# Patient Record
Sex: Female | Born: 1985 | Race: Black or African American | Hispanic: No | Marital: Single | Smoking: Current every day smoker
Health system: Southern US, Community
[De-identification: ages and names within clinical notes are randomized; demographics above are authoritative.]

## PROBLEM LIST (undated history)

## (undated) DIAGNOSIS — E66813 Obesity, class 3: Secondary | ICD-10-CM

## (undated) DIAGNOSIS — I10 Essential (primary) hypertension: Secondary | ICD-10-CM

## (undated) DIAGNOSIS — O24419 Gestational diabetes mellitus in pregnancy, unspecified control: Secondary | ICD-10-CM

## (undated) DIAGNOSIS — I509 Heart failure, unspecified: Secondary | ICD-10-CM

## (undated) DIAGNOSIS — Z8719 Personal history of other diseases of the digestive system: Secondary | ICD-10-CM

## (undated) DIAGNOSIS — R7303 Prediabetes: Secondary | ICD-10-CM

## (undated) HISTORY — DX: Personal history of other diseases of the digestive system: Z87.19

## (undated) HISTORY — DX: Morbid (severe) obesity due to excess calories: E66.01

## (undated) HISTORY — DX: Obesity, class 3: E66.813

## (undated) HISTORY — DX: Heart failure, unspecified: I50.9

## (undated) HISTORY — PX: LAPAROSCOPY: SHX197

## (undated) HISTORY — DX: Essential (primary) hypertension: I10

## (undated) HISTORY — DX: Prediabetes: R73.03

---

## 2013-05-08 DIAGNOSIS — R0683 Snoring: Secondary | ICD-10-CM | POA: Insufficient documentation

## 2013-05-08 DIAGNOSIS — K219 Gastro-esophageal reflux disease without esophagitis: Secondary | ICD-10-CM | POA: Insufficient documentation

## 2013-05-08 DIAGNOSIS — L83 Acanthosis nigricans: Secondary | ICD-10-CM | POA: Insufficient documentation

## 2015-02-22 NOTE — L&D Delivery Note (Signed)
Delivery Summary for [name] Wolf  Labor Events:   Preterm labor:   Rupture date:   Rupture time:   Rupture type: Artificial  Fluid Color: Clear  Induction:   Augmentation:   Complications:   Cervical ripening:          Delivery:   Episiotomy:   Lacerations:   Repair suture:   Repair # of packets:   Blood loss (ml): 200    Information for the patient's newborn:  [name], Boy [name] [[id number]][[id number]]    Delivery 10/11/2015 9:29 PM by  Vaginal, Spontaneous Delivery Sex:  female Gestational Age: 7650w3d Delivery Clinician:   Living?:         APGARS  One minute Five minutes Ten minutes  Skin color:        Heart rate:        Grimace:        Muscle tone:        Breathing:        Totals: 8  9      Presentation/position:      Resuscitation:   Cord information:    Disposition of cord blood:     Blood gases sent?  Complications:   Placenta: Delivered:       appearance Newborn Measurements: Weight: 8 lb 6 oz (3800 g)  Height: 8.47"  Head circumference:    Chest circumference:    Other providers:    Additional  information: Forceps:   Vacuum:   Breech:   Observed anomalies         Delivery Note At 9:29 PM a viable and healthy female was delivered via Vaginal, Spontaneous Delivery (Presentation: Vertex; LOA position).  APGAR: 8, 9; weight 8 lb 6 oz (3800 g).   Placenta status: spontaneously removed, intact.  Cord: 3-vessel, with the following complications: None.  Cord pH: not obtained.   Anesthesia:  Epidural Episiotomy: None Lacerations: None Suture Repair: None Est. Blood Loss (mL):  200  Mom to postpartum.  Baby to Couplet care / Skin to Skin.   [name] 10/11/2015, 10:41 PM

## 2015-03-06 ENCOUNTER — Other Ambulatory Visit (INDEPENDENT_AMBULATORY_CARE_PROVIDER_SITE_OTHER): Payer: Medicaid Other

## 2015-03-06 ENCOUNTER — Ambulatory Visit (INDEPENDENT_AMBULATORY_CARE_PROVIDER_SITE_OTHER): Payer: Medicaid Other | Admitting: Obstetrics and Gynecology

## 2015-03-06 VITALS — BP 118/90 | HR 98 | Ht 62.6 in | Wt 244.5 lb

## 2015-03-06 DIAGNOSIS — Z331 Pregnant state, incidental: Secondary | ICD-10-CM

## 2015-03-06 DIAGNOSIS — Z36 Encounter for antenatal screening of mother: Secondary | ICD-10-CM

## 2015-03-06 DIAGNOSIS — Z1389 Encounter for screening for other disorder: Secondary | ICD-10-CM

## 2015-03-06 DIAGNOSIS — R638 Other symptoms and signs concerning food and fluid intake: Secondary | ICD-10-CM

## 2015-03-06 DIAGNOSIS — Z3490 Encounter for supervision of normal pregnancy, unspecified, unspecified trimester: Secondary | ICD-10-CM

## 2015-03-06 DIAGNOSIS — Z113 Encounter for screening for infections with a predominantly sexual mode of transmission: Secondary | ICD-10-CM

## 2015-03-06 DIAGNOSIS — Z3687 Encounter for antenatal screening for uncertain dates: Secondary | ICD-10-CM

## 2015-03-06 DIAGNOSIS — Z369 Encounter for antenatal screening, unspecified: Secondary | ICD-10-CM

## 2015-03-06 NOTE — Progress Notes (Signed)
   [name] Wolf presents for NOB nurse interview visit. G-2.  P-1. Pt pregnancy [facility] at [facility] 02/12/15. LMP: 01/08/2015. EDD: 10/15/2015. Ultrasound ordered for unsure of lmp, viability and dating.  Pregnancy education material explained and given. No cats in the home. NOB labs ordered. TSH/HbgA1c due to Increased BMI, HIV labs and Drug screen were explained optional and she could opt out of tests but did not decline. Drug screen ordered. PNV encouraged. NT to discuss with provider. Pt. To follow up with provider in 4 weeks for NOB physical.  All questions answered.  ZIKA EXPOSURE SCREEN:  The patient has not traveled to a [place] endemic area within the past 6 months, nor has she had unprotected sex with a partner who has travelled to a [place] endemic region within the past 6 months. The patient has been advised to notify us if these factors change any time during this current pregnancy, so adequate testing and monitoring can be initiated.

## 2015-03-06 NOTE — Progress Notes (Signed)
I have reviewed the information as documented by Fenton Mallingebbie Ridgeway, LPN.

## 2015-03-06 NOTE — Patient Instructions (Signed)
Pregnancy and Zika Virus Disease Zika virus disease, or Zika, is an illness that can spread to people from mosquitoes that carry the virus. It may also spread from person to person through infected body fluids. Zika first occurred in [place], but recently it has spread to new areas. The virus occurs in tropical climates. The location of Zika continues to change. Most people who become infected with Zika virus do not develop serious illness. However, Zika may cause birth defects in an unborn baby whose mother is infected with the virus. It may also increase the risk of miscarriage. WHAT ARE THE SYMPTOMS OF ZIKA VIRUS DISEASE? In many cases, people who have been infected with Zika virus do not develop any symptoms. If symptoms appear, they usually start about a week after the person is infected. Symptoms are usually mild. They may include:  Fever.  Rash.  Red eyes.  Joint pain. HOW DOES ZIKA VIRUS DISEASE SPREAD? The main way that Zika virus spreads is through the bite of a certain type of mosquito. Unlike most types of mosquitos, which bite only at night, the type of mosquito that carries Zika virus bites both at night and during the day. Zika virus can also spread through sexual contact, through a blood transfusion, and from a mother to her baby before or during birth. Once you have had Zika virus disease, it is unlikely that you will get it again. CAN I PASS ZIKA TO MY BABY DURING PREGNANCY? Yes, Zika can pass from a mother to her baby before or during birth. WHAT PROBLEMS CAN ZIKA CAUSE FOR MY BABY? A woman who is infected with Zika virus while pregnant is at risk of having her baby born with a condition in which the brain or head is smaller than expected (microcephaly). Babies who have microcephaly can have developmental delays, seizures, hearing problems, and vision problems. Having Zika virus disease during pregnancy can also increase the risk of miscarriage. HOW CAN ZIKA VIRUS DISEASE BE  PREVENTED? There is no vaccine to prevent Zika. The best way to prevent the disease is to avoid infected mosquitoes and avoid exposure to body fluids that can spread the virus. Avoid any possible exposure to Zika by taking the following precautions. For women and their sex partners:  Avoid traveling to high-risk areas. The locations where Zika is being reported change often. To identify high-risk areas, check the CDC travel website: www.cdc.gov/zika/geo/index.html  If you or your sex partner must travel to a high-risk area, talk with a health care provider before and after traveling.  Take all precautions to avoid mosquito bites if you live in, or travel to, any of the high-risk areas. Insect repellents are safe to use during pregnancy.  Ask your health care provider when it is safe to have sexual contact. For women:  If you are pregnant or trying to become pregnant, avoid sexual contact with persons who may have been exposed to Zika virus, persons who have possible symptoms of Zika, or persons whose history you are unsure about. If you choose to have sexual contact with someone who may have been exposed to Zika virus, use condoms correctly during the entire duration of sexual activity, every time. Do not share sexual devices, as you may be exposed to body fluids.  Ask your health care provider about when it is safe to attempt pregnancy after a possible exposure to Zika virus. WHAT STEPS SHOULD I TAKE TO AVOID MOSQUITO BITES? Take these steps to avoid mosquito bites when you are   in a high-risk area:  Wear loose clothing that covers your arms and legs.  Limit your outdoor activities.  Do not open windows unless they have window screens.  Sleep under mosquito nets.  Use insect repellent. The best insect repellents have:  DEET, picaridin, oil of lemon eucalyptus (OLE), or IR3535 in them.  Higher amounts of an active ingredient in them.  Remember that insect repellents are safe to use  during pregnancy.  Do not use OLE on children who are younger than 3 years of age. Do not use insect repellent on babies who are younger than 2 months of age.  Cover your child's stroller with mosquito netting. Make sure the netting fits snugly and that any loose netting does not cover your child's mouth or nose. Do not use a blanket as a mosquito-protection cover.  Do not apply insect repellent underneath clothing.  If you are using sunscreen, apply the sunscreen before applying the insect repellent.  Treat clothing with permethrin. Do not apply permethrin directly to your skin. Follow label directions for safe use.  Get rid of standing water, where mosquitoes may reproduce. Standing water is often found in items such as buckets, bowls, animal food dishes, and flowerpots. When you return from traveling to any high-risk area, continue taking actions to protect yourself against mosquito bites for 3 weeks, even if you show no signs of illness. This will prevent spreading Zika virus to uninfected mosquitoes. WHAT SHOULD I KNOW ABOUT THE SEXUAL TRANSMISSION OF ZIKA? People can spread Zika to their sexual partners during vaginal, anal, or oral sex, or by sharing sexual devices. Many people with Zika do not develop symptoms, so a person could spread the disease without knowing that they are infected. The greatest risk is to women who are pregnant or who may become pregnant. Zika virus can live longer in semen than it can live in blood. Couples can prevent sexual transmission of the virus by:  Using condoms correctly during the entire duration of sexual activity, every time. This includes vaginal, anal, and oral sex.  Not sharing sexual devices. Sharing increases your risk of being exposed to body fluid from another person.  Avoiding all sexual activity until your health care provider says it is safe. SHOULD I BE TESTED FOR ZIKA VIRUS? A sample of your blood can be tested for Zika virus. A pregnant  woman should be tested if she may have been exposed to the virus or if she has symptoms of Zika. She may also have additional tests done during her pregnancy, such ultrasound testing. Talk with your health care provider about which tests are recommended.   This information is not intended to replace advice given to you by your health care provider. Make sure you discuss any questions you have with your health care provider.   Document Released: 10/29/2014 Document Reviewed: 10/22/2014 Elsevier Interactive Patient Education 2016 [employer] Commonly Asked Questions During Pregnancy  Cats: A parasite can be excreted in cat feces.  To avoid exposure you need to have another person empty the little box.  If you must empty the litter box you will need to wear gloves.  Wash your hands after handling your cat.  This parasite can also be found in raw or undercooked meat so this should also be avoided.  Colds, Sore Throats, Flu: Please check your medication sheet to see what you can take for symptoms.  If your symptoms are unrelieved by these medications please call the office.  Dental Work: Most   any dental work your dentist recommends is permitted.  X-rays should only be taken during the first trimester if absolutely necessary.  Your abdomen should be shielded with a lead apron during all x-rays.  Please notify your provider prior to receiving any x-rays.  Novocaine is fine; gas is not recommended.  If your dentist requires a note from us prior to dental work please call the office and we will provide one for you.  Exercise: Exercise is an important part of staying healthy during your pregnancy.  You may continue most exercises you were accustomed to prior to pregnancy.  Later in your pregnancy you will most likely notice you have difficulty with activities requiring balance like riding a bicycle.  It is important that you listen to your body and avoid activities that put you at a higher risk of falling.   Adequate rest and staying well hydrated are a must!  If you have questions about the safety of specific activities ask your provider.    Exposure to Children with illness: Try to avoid obvious exposure; report any symptoms to us when noted,  If you have chicken pos, red measles or mumps, you should be immune to these diseases.   Please do not take any vaccines while pregnant unless you have checked with your OB provider.  Fetal Movement: After 28 weeks we recommend you do "kick counts" twice daily.  Lie or sit down in a calm quiet environment and count your baby movements "kicks".  You should feel your baby at least 10 times per hour.  If you have not felt 10 kicks within the first hour get up, walk around and have something sweet to eat or drink then repeat for an additional hour.  If count remains less than 10 per hour notify your provider.  Fumigating: Follow your pest control agent's advice as to how long to stay out of your home.  Ventilate the area well before re-entering.  Hemorrhoids:   Most over-the-counter preparations can be used during pregnancy.  Check your medication to see what is safe to use.  It is important to use a stool softener or fiber in your diet and to drink lots of liquids.  If hemorrhoids seem to be getting worse please call the office.   Hot Tubs:  Hot tubs Jacuzzis and saunas are not recommended while pregnant.  These increase your internal body temperature and should be avoided.  Intercourse:  Sexual intercourse is safe during pregnancy as long as you are comfortable, unless otherwise advised by your provider.  Spotting may occur after intercourse; report any bright red bleeding that is heavier than spotting.  Labor:  If you know that you are in labor, please go to the hospital.  If you are unsure, please call the office and let us help you decide what to do.  Lifting, straining, etc:  If your job requires heavy lifting or straining please check with your provider for  any limitations.  Generally, you should not lift items heavier than that you can lift simply with your hands and arms (no back muscles)  Painting:  Paint fumes do not harm your pregnancy, but may make you ill and should be avoided if possible.  Latex or water based paints have less odor than oils.  Use adequate ventilation while painting.  Permanents & Hair Color:  Chemicals in hair dyes are not recommended as they cause increase hair dryness which can increase hair loss during pregnancy.  " Highlighting" and permanents are   allowed.  Dye may be absorbed differently and permanents may not hold as well during pregnancy.  Sunbathing:  Use a sunscreen, as skin burns easily during pregnancy.  Drink plenty of fluids; avoid over heating.  Tanning Beds:  Because their possible side effects are still unknown, tanning beds are not recommended.  Ultrasound Scans:  Routine ultrasounds are performed at approximately 20 weeks.  You will be able to see your baby's general anatomy an if you would like to know the gender this can usually be determined as well.  If it is questionable when you conceived you may also receive an ultrasound early in your pregnancy for dating purposes.  Otherwise ultrasound exams are not routinely performed unless there is a medical necessity.  Although you can request a scan we ask that you pay for it when conducted because insurance does not cover " patient request" scans.  Work: If your pregnancy proceeds without complications you may work until your due date, unless your physician or employer advises otherwise.  Round Ligament Pain/Pelvic Discomfort:  Sharp, shooting pains not associated with bleeding are fairly common, usually occurring in the second trimester of pregnancy.  They tend to be worse when standing up or when you remain standing for long periods of time.  These are the result of pressure of certain pelvic ligaments called "round ligaments".  Rest, Tylenol and heat seem to be  the most effective relief.  As the womb and fetus grow, they rise out of the pelvis and the discomfort improves.  Please notify the office if your pain seems different than that described.  It may represent a more serious condition.   

## 2015-03-07 LAB — URINALYSIS, ROUTINE W REFLEX MICROSCOPIC
BILIRUBIN UA: NEGATIVE
Glucose, UA: NEGATIVE
Ketones, UA: NEGATIVE
Leukocytes, UA: NEGATIVE
Nitrite, UA: NEGATIVE
PH UA: 6 (ref 5.0–7.5)
RBC UA: NEGATIVE
Urobilinogen, Ur: 0.2 mg/dL (ref 0.2–1.0)

## 2015-03-07 LAB — PAIN MGT SCRN (14 DRUGS), UR
Amphetamine Screen, Ur: NEGATIVE ng/mL
BARBITURATE SCRN UR: NEGATIVE ng/mL
BENZODIAZEPINE SCREEN, URINE: NEGATIVE ng/mL
BUPRENORPHINE, URINE: NEGATIVE ng/mL
CREATININE(CRT), U: 285.3 mg/dL (ref 20.0–300.0)
Cannabinoids Ur Ql Scn: NEGATIVE ng/mL
Cocaine(Metab.)Screen, Urine: NEGATIVE ng/mL
FENTANYL, URINE: NEGATIVE pg/mL
MEPERIDINE SCREEN, URINE: NEGATIVE ng/mL
METHADONE SCREEN, URINE: NEGATIVE ng/mL
OPIATE SCRN UR: NEGATIVE ng/mL
Oxycodone+Oxymorphone Ur Ql Scn: NEGATIVE ng/mL
PCP Scrn, Ur: NEGATIVE ng/mL
Ph of Urine: 5.7 (ref 4.5–8.9)
Propoxyphene, Screen: NEGATIVE ng/mL
Tramadol Ur Ql Scn: NEGATIVE ng/mL

## 2015-03-07 LAB — NICOTINE SCREEN, URINE: Cotinine Ql Scrn, Ur: POSITIVE ng/mL

## 2015-03-08 LAB — GC/CHLAMYDIA PROBE AMP
Chlamydia trachomatis, NAA: NEGATIVE
Neisseria gonorrhoeae by PCR: NEGATIVE

## 2015-03-09 LAB — CBC WITH DIFFERENTIAL/PLATELET
BASOS: 1 %
Basophils Absolute: 0.1 10*3/uL (ref 0.0–0.2)
EOS (ABSOLUTE): 0.1 10*3/uL (ref 0.0–0.4)
EOS: 2 %
HEMATOCRIT: 36.1 % (ref 34.0–46.6)
Hemoglobin: 11.9 g/dL (ref 11.1–15.9)
Immature Grans (Abs): 0 10*3/uL (ref 0.0–0.1)
Immature Granulocytes: 0 %
LYMPHS ABS: 3.2 10*3/uL — AB (ref 0.7–3.1)
Lymphs: 43 %
MCH: 28.1 pg (ref 26.6–33.0)
MCHC: 33 g/dL (ref 31.5–35.7)
MCV: 85 fL (ref 79–97)
MONOS ABS: 0.5 10*3/uL (ref 0.1–0.9)
Monocytes: 7 %
Neutrophils Absolute: 3.6 10*3/uL (ref 1.4–7.0)
Neutrophils: 47 %
Platelets: 336 10*3/uL (ref 150–379)
RBC: 4.24 x10E6/uL (ref 3.77–5.28)
RDW: 13.1 % (ref 12.3–15.4)
WBC: 7.5 10*3/uL (ref 3.4–10.8)

## 2015-03-09 LAB — TSH: TSH: 1.31 u[IU]/mL (ref 0.450–4.500)

## 2015-03-09 LAB — RH TYPE: RH TYPE: POSITIVE

## 2015-03-09 LAB — CULTURE, OB URINE

## 2015-03-09 LAB — URINE CULTURE, OB REFLEX

## 2015-03-09 LAB — SICKLE CELL SCREEN: SICKLE CELL SCREEN: NEGATIVE

## 2015-03-09 LAB — RUBELLA ANTIBODY, IGM

## 2015-03-09 LAB — VARICELLA ZOSTER ANTIBODY, IGM: VARICELLA IGM: 1.65 {index} — AB (ref 0.00–0.90)

## 2015-03-09 LAB — HEMOGLOBIN A1C
ESTIMATED AVERAGE GLUCOSE: 123 mg/dL
Hgb A1c MFr Bld: 5.9 % — ABNORMAL HIGH (ref 4.8–5.6)

## 2015-03-09 LAB — HIV ANTIBODY (ROUTINE TESTING W REFLEX): HIV Screen 4th Generation wRfx: NONREACTIVE

## 2015-03-09 LAB — RPR: RPR Ser Ql: NONREACTIVE

## 2015-03-09 LAB — HEPATITIS B SURFACE ANTIGEN: HEP B S AG: NEGATIVE

## 2015-03-09 LAB — ABO

## 2015-03-09 LAB — ANTIBODY SCREEN: Antibody Screen: NEGATIVE

## 2015-03-23 ENCOUNTER — Telehealth: Payer: Self-pay | Admitting: Obstetrics and Gynecology

## 2015-03-23 NOTE — Telephone Encounter (Signed)
PRENATALS THAT SHE IS ON IS MAKING SICK ON HER STOMACH CAN SHE HAVE SOMETHING ELSE.

## 2015-03-25 NOTE — Telephone Encounter (Signed)
Called pt advised her to take prenatal vitamin at night before going to be bed to help with upset stomach. Also advised pt on prenatal gummy. Pt gave verbal understanding.

## 2015-04-09 ENCOUNTER — Ambulatory Visit (INDEPENDENT_AMBULATORY_CARE_PROVIDER_SITE_OTHER): Payer: Medicaid Other | Admitting: Obstetrics and Gynecology

## 2015-04-09 VITALS — BP 108/69 | HR 108 | Wt 245.2 lb

## 2015-04-09 DIAGNOSIS — Z1379 Encounter for other screening for genetic and chromosomal anomalies: Secondary | ICD-10-CM

## 2015-04-09 DIAGNOSIS — IMO0001 Reserved for inherently not codable concepts without codable children: Secondary | ICD-10-CM

## 2015-04-09 DIAGNOSIS — Z3492 Encounter for supervision of normal pregnancy, unspecified, second trimester: Secondary | ICD-10-CM

## 2015-04-09 DIAGNOSIS — Z283 Underimmunization status: Secondary | ICD-10-CM

## 2015-04-09 DIAGNOSIS — Z3482 Encounter for supervision of other normal pregnancy, second trimester: Secondary | ICD-10-CM

## 2015-04-09 DIAGNOSIS — O358XX0 Maternal care for other (suspected) fetal abnormality and damage, not applicable or unspecified: Secondary | ICD-10-CM

## 2015-04-09 DIAGNOSIS — O09899 Supervision of other high risk pregnancies, unspecified trimester: Secondary | ICD-10-CM

## 2015-04-09 DIAGNOSIS — Z720 Tobacco use: Secondary | ICD-10-CM

## 2015-04-09 DIAGNOSIS — R7303 Prediabetes: Secondary | ICD-10-CM

## 2015-04-09 DIAGNOSIS — Z124 Encounter for screening for malignant neoplasm of cervix: Secondary | ICD-10-CM

## 2015-04-09 DIAGNOSIS — O9989 Other specified diseases and conditions complicating pregnancy, childbirth and the puerperium: Secondary | ICD-10-CM

## 2015-04-09 NOTE — Progress Notes (Signed)
OBSTETRIC INITIAL PRENATAL VISIT  Subjective:    [name] Wolf is being seen today for her first obstetrical visit.  This is not a planned pregnancy. She is a G70P1001 female at 35w0dgestation, Estimated Date of Delivery: 10/15/15 with Patient's last menstrual period was 01/08/2015 (approximate). (consistent with 7 week sono). Her obstetrical history is significant for obesity, smoker and pre-diabetes. Relationship with FOB: significant other, not living together. Patient does intend to breast feed. Pregnancy history fully reviewed.    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 No genetic disorders identified.       Gynecologic History:  Last pap smear was ~ 3 years ago.  Results were normal.  Denies h/o abnormal pap smears in the past.  Denies history of STIs.  H/o regular menstrual cycles.    Past Medical History  Diagnosis Date  . Hx of gallstones 124ysld    LAP removal of stones    Family History  Problem Relation Age of Onset  . Diabetes Sister   . Hypertension Mother   . Migraines Sister     Past Surgical History  Procedure Laterality Date  . Laparoscopy  1264o    gallstones removed    Social History   Social History  . Marital Status: Single    Spouse Name: N/A  . Number of Children: N/A  . Years of Education: N/A   Occupational History  . unemployed    Social History Main Topics  . Smoking status: Current Every Day Smoker  . Smokeless tobacco: Never Used  . Alcohol Use: No  . Drug Use: No  . Sexual Activity:    Partners: Male   Other Topics Concern  . Not on file   Social History Narrative  . No narrative on file    No current outpatient prescriptions on file prior  to visit.   No current facility-administered medications on file prior to visit.    No Known Allergies   Review of Systems General:Not Present- Fever, Weight Loss and Weight Gain. Skin:Not Present- Rash. HEENT:Not Present- Blurred Vision, Headache and Bleeding Gums. Respiratory:Not Present- Difficulty Breathing. Breast:Not Present- Breast Mass. Cardiovascular:Not Present- Chest Pain, Elevated Blood Pressure, Fainting / Blacking Out and Shortness of Breath. Gastrointestinal:Not Present- Abdominal Pain, Constipation, Nausea and Vomiting. Female Genitourinary:Not Present- Frequency, Painful Urination, Pelvic Pain, Vaginal Bleeding, Vaginal Discharge, Contractions, regular, Fetal Movements Decreased, Urinary Complaints and Vaginal Fluid. Musculoskeletal:Not Present- Back Pain and Leg Cramps. Neurological:Not Present- Dizziness. Psychiatric:Not Present- Depression.     Objective:   Blood pressure 108/69, pulse 108, weight 245 lb 3.2 oz (111.222 kg), last menstrual period 01/08/2015.  Body mass index is 43.99 kg/(m^2).  General Appearance:    Alert, cooperative, no distress, appears stated age, obese  Head:    Normocephalic, without obvious abnormality, atraumatic  Eyes:    PERRL, conjunctiva/corneas clear, EOM's intact, both eyes  Ears:    Normal external ear canals, both ears  Nose:   Nares normal,  septum midline, mucosa normal, no drainage or sinus tenderness  Throat:   Lips, mucosa, and tongue normal; teeth and gums normal  Neck:   Supple, symmetrical, trachea midline, no adenopathy; thyroid: no enlargement/tenderness/nodules; no carotid bruit or JVD  Back:     Symmetric, no curvature, ROM normal, no CVA tenderness  Lungs:     Clear to auscultation bilaterally, respirations unlabored  Chest Wall:    No tenderness or deformity   Heart:    Regular rate and rhythm, S1 and S2 normal, no murmur, rub or gallop  Breast Exam:    No tenderness, masses, or nipple abnormality    Abdomen:     Soft, non-tender, bowel sounds active all four quadrants, no masses, no organomegaly.  FH difficult to assess due to body habitus.  FHT 166 bpm.  Genitalia:    Pelvic:external genitalia normal, vagina without lesions, discharge, or tenderness, rectovaginal septum  normal. Cervix normal in appearance, no cervical motion tenderness, no adnexal masses or tenderness.  Pregnancy positive findings: uterine enlargement: 14 wk size (although difficult to assess due to body habitus), nontender.   Rectal:    Normal external sphincter.  No hemorrhoids appreciated. Internal exam not done.   Extremities:   Extremities normal, atraumatic, no cyanosis or edema  Pulses:   2+ and symmetric all extremities  Skin:   Skin color, texture, turgor normal, no rashes or lesions  Lymph nodes:   Cervical, supraclavicular, and axillary nodes normal  Neurologic:   CNII-XII intact, normal strength, sensation and reflexes throughout    Assessment:    Pregnancy at 13 and 0/7 weeks   Obesity Class III Tobacco abuse Prior fetus with isolated congenital anomaly Pre-diabetes Rubella non-immune   Plan:   Initial labs reviewed.  Needs MMR postpartum  Prenatal vitamins encouraged. Problem list reviewed and updated. New OB counseling:  The patient has been given an overview regarding routine prenatal care.  Recommendations regarding diet, weight gain, and exercise in pregnancy were given.  Patient advised not to gain any more than 15 lbs this pregnancy.  Prenatal testing, optional genetic testing, and ultrasound use in pregnancy were reviewed.  AFP3 discussed: ordered.  Patient may also benefit from consultation with MFM due to h/o fetal congenital anomalies in prior pregnancy.  Benefits of Breast Feeding were discussed. The patient is encouraged to consider nursing her baby post partum. Tobacco use - patient is down to ~2-3 cig/every 2-3 days.  Is having a hard time quitting.  Advised on assessing triggers,  nicotine gum (has tried non-nicotine gum/lozenges/candy without success).  Will f/u progress at next visit.  Advised on beginning daily baby aspirin due to obesity as risk factor for pre-eclampsia.  Prediabetes with recent HgbA1c 5.9%. Will need early glucola.  Can perform at same time that she performs 1st trimester screen next week.  Follow up in 4 weeks.  15% of 30 min visit spent on counseling and coordination of care.

## 2015-04-09 NOTE — Patient Instructions (Signed)
RE: MyChart  Dear [name]  We are excited to introduce MyChart, a new best-in-class service that provides you online access to important information in your electronic medical record. We want to make it easier for you to view your health information - all in one secure location - when and where you need it. We expect MyChart will enhance the quality of care and service we provide. Use the activation code below to enroll in MyChart online at [email or web address]  When you register for MyChart, you can:  [name] View your test results. . Communicate securely with your physician's office.  . View your medical history, allergies, medications, and immunizations. . Conveniently print information such as your medication lists.  If you are age 29 or older and want a member of your family to have access to your record, you must provide written consent by completing a proxy form available at our facility. Please speak to our clinical staff about guidelines regarding accounts for patients younger than age 53.  As you activate your MyChart account and need any technical assistance, please call the MyChart technical support line at [phone]) or email your question to [email or web address]. If you email your question(s), please include your name, a return phone number and the best time to reach you.  Thank you for using MyChart as your new health and wellness resource!  MyChart Activation Code:  [email or web address] Expires: 04/19/2015  2:38 PM          [facility]  [address] [place], [place] [address]      Prenatal Care WHAT IS PRENATAL CARE?  Prenatal care is the process of caring for a pregnant woman before she gives birth. Prenatal care makes sure that she and her baby remain as healthy as possible throughout pregnancy. Prenatal care may be provided by a midwife, family practice health care provider, or a childbirth and pregnancy specialist  (obstetrician). Prenatal care may include physical examinations, testing, treatments, and education on nutrition, lifestyle, and social support services. WHY IS PRENATAL CARE SO IMPORTANT?  Early and consistent prenatal care increases the chance that you and your baby will remain healthy throughout your pregnancy. This type of care also decreases a baby's risk of being born too early (prematurely), or being born smaller than expected (small for gestational age). Any underlying medical conditions you may have that could pose a risk during your pregnancy are discussed during prenatal care visits. You will also be monitored regularly for any new conditions that may arise during your pregnancy so they can be treated quickly and effectively. WHAT HAPPENS DURING PRENATAL CARE VISITS? Prenatal care visits may include the following: Discussion Tell your health care provider about any new signs or symptoms you have experienced since your last visit. These might include:  Nausea or vomiting.  Increased or decreased level of energy.  Difficulty sleeping.  Back or leg pain.  Weight changes.  Frequent urination.  Shortness of breath with physical activity.  Changes in your skin, such as the development of a rash or itchiness.  Vaginal discharge or bleeding.  Feelings of excitement or nervousness.  Changes in your baby's movements. You may want to write down any questions or topics you want to discuss with your health care provider and bring them with you to your appointment. Examination During your first prenatal care visit, you will likely have a complete physical exam. Your health care provider will often examine your vagina, cervix, and the position of your uterus, as  well as check your heart, lungs, and other body systems. As your pregnancy progresses, your health care provider will measure the size of your uterus and your baby's position inside your uterus. He or she may also examine you for  early signs of labor. Your prenatal visits may also include checking your blood pressure and, after about 10-12 weeks of pregnancy, listening to your baby's heartbeat. Testing Regular testing often includes:  Urinalysis. This checks your urine for glucose, protein, or signs of infection.  Blood count. This checks the levels of white and red blood cells in your body.  Tests for sexually transmitted infections (STIs). Testing for STIs at the beginning of pregnancy is routinely done and is required in many states.  Antibody testing. You will be checked to see if you are immune to certain illnesses, such as rubella, that can affect a developing fetus.  Glucose screen. Around 24-28 weeks of pregnancy, your blood glucose level will be checked for signs of gestational diabetes. Follow-up tests may be recommended.  Group B strep. This is a bacteria that is commonly found inside a woman's vagina. This test will inform your health care provider if you need an antibiotic to reduce the amount of this bacteria in your body prior to labor and childbirth.  Ultrasound. Many pregnant women undergo an ultrasound screening around 18-20 weeks of pregnancy to evaluate the health of the fetus and check for any developmental abnormalities.  HIV (human immunodeficiency virus) testing. Early in your pregnancy, you will be screened for HIV. If you are at high risk for HIV, this test may be repeated during your third trimester of pregnancy. You may be offered other testing based on your age, personal or family medical history, or other factors.  HOW OFTEN SHOULD I PLAN TO SEE MY HEALTH CARE PROVIDER FOR PRENATAL CARE? Your prenatal care check-up schedule depends on any medical conditions you have before, or develop during, your pregnancy. If you do not have any underlying medical conditions, you will likely be seen for checkups:  Monthly, during the first 6 months of pregnancy.  Twice a month during months 7 and 8 of  pregnancy.  Weekly starting in the 9th month of pregnancy and until delivery. If you develop signs of early labor or other concerning signs or symptoms, you may need to see your health care provider more often. Ask your health care provider what prenatal care schedule is best for you. WHAT CAN I DO TO KEEP MYSELF AND MY BABY AS HEALTHY AS POSSIBLE DURING MY PREGNANCY?  Take a prenatal vitamin containing 400 micrograms (0.4 mg) of folic acid every day. Your health care provider may also ask you to take additional vitamins such as iodine, vitamin D, iron, copper, and zinc.  Take 1500-2000 mg of calcium daily starting at your 20th week of pregnancy until you deliver your baby.  Make sure you are up to date on your vaccinations. Unless directed otherwise by your health care provider:  You should receive a tetanus, diphtheria, and pertussis (Tdap) vaccination between the 27th and 36th week of your pregnancy, regardless of when your last Tdap immunization occurred. This helps protect your baby from whooping cough (pertussis) after he or she is born.  You should receive an annual inactivated influenza vaccine (IIV) to help protect you and your baby from influenza. This can be done at any point during your pregnancy.  Eat a well-rounded diet that includes:  Fresh fruits and vegetables.  Lean proteins.  Calcium-rich foods such  as milk, yogurt, hard cheeses, and dark, leafy greens.  Whole grain breads.  Do noteat seafood high in mercury, including:  Swordfish.  Tilefish.  Shark.  King mackerel.  More than 6 oz tuna per week.  Do not eat:  Raw or undercooked meats or eggs.  Unpasteurized foods, such as soft cheeses (brie, blue, or feta), juices, and milks.  Lunch meats.  Hot dogs that have not been heated until they are steaming.  Drink enough water to keep your urine clear or pale yellow. For many women, this may be 10 or more 8 oz glasses of water each day. Keeping yourself  hydrated helps deliver nutrients to your baby and may prevent the start of pre-term uterine contractions.  Do not use any tobacco products including cigarettes, chewing tobacco, or electronic cigarettes. If you need help quitting, ask your health care provider.  Do not drink beverages containing alcohol. No safe level of alcohol consumption during pregnancy has been determined.  Do not use any illegal drugs. These can harm your developing baby or cause a miscarriage.  Ask your health care provider or pharmacist before taking any prescription or over-the-counter medicines, herbs, or supplements.  Limit your caffeine intake to no more than 200 mg per day.  Exercise. Unless told otherwise by your health care provider, try to get 30 minutes of moderate exercise most days of the week. Do not  do high-impact activities, contact sports, or activities with a high risk of falling, such as horseback riding or downhill skiing.  Get plenty of rest.  Avoid anything that raises your body temperature, such as hot tubs and saunas.  If you own a cat, do not empty its litter box. Bacteria contained in cat feces can cause an infection called toxoplasmosis. This can result in serious harm to the fetus.  Stay away from chemicals such as insecticides, lead, mercury, and cleaning or paint products that contain solvents.  Do not have any X-rays taken unless medically necessary.  Take a childbirth and breastfeeding preparation class. Ask your health care provider if you need a referral or recommendation.   This information is not intended to replace advice given to you by your health care provider. Make sure you discuss any questions you have with your health care provider.   Document Released: 02/10/2003 Document Revised: 02/28/2014 Document Reviewed: 04/24/2013 Elsevier Interactive Patient Education [employer]

## 2015-04-10 ENCOUNTER — Encounter: Payer: Self-pay | Admitting: Obstetrics and Gynecology

## 2015-04-10 DIAGNOSIS — Z283 Underimmunization status: Secondary | ICD-10-CM | POA: Insufficient documentation

## 2015-04-10 DIAGNOSIS — Z720 Tobacco use: Secondary | ICD-10-CM

## 2015-04-10 DIAGNOSIS — E66813 Obesity, class 3: Secondary | ICD-10-CM | POA: Insufficient documentation

## 2015-04-10 DIAGNOSIS — IMO0002 Reserved for concepts with insufficient information to code with codable children: Secondary | ICD-10-CM | POA: Insufficient documentation

## 2015-04-10 DIAGNOSIS — O09899 Supervision of other high risk pregnancies, unspecified trimester: Secondary | ICD-10-CM | POA: Insufficient documentation

## 2015-04-10 DIAGNOSIS — Z1379 Encounter for other screening for genetic and chromosomal anomalies: Secondary | ICD-10-CM | POA: Insufficient documentation

## 2015-04-10 DIAGNOSIS — R7303 Prediabetes: Secondary | ICD-10-CM | POA: Insufficient documentation

## 2015-04-10 DIAGNOSIS — O9989 Other specified diseases and conditions complicating pregnancy, childbirth and the puerperium: Secondary | ICD-10-CM

## 2015-04-10 DIAGNOSIS — O0993 Supervision of high risk pregnancy, unspecified, third trimester: Secondary | ICD-10-CM | POA: Insufficient documentation

## 2015-04-10 HISTORY — DX: Tobacco use: Z72.0

## 2015-04-10 NOTE — Addendum Note (Signed)
Addended by: Fabian November on: 04/10/2015 08:51 PM   Modules accepted: Orders

## 2015-04-13 NOTE — Addendum Note (Signed)
Addended by: Frankey Shown on: 04/13/2015 02:24 PM   Modules accepted: Orders

## 2015-04-15 ENCOUNTER — Other Ambulatory Visit: Payer: Medicaid Other

## 2015-04-17 LAB — PAP IG, CT-NG, RFX HPV ASCU
CHLAMYDIA, NUC. ACID AMP: NEGATIVE
Gonococcus by Nucleic Acid Amp: NEGATIVE
PAP SMEAR COMMENT: 0

## 2015-04-23 ENCOUNTER — Ambulatory Visit: Payer: Self-pay

## 2015-04-23 ENCOUNTER — Institutional Professional Consult (permissible substitution): Payer: Self-pay

## 2015-04-24 ENCOUNTER — Telehealth: Payer: Self-pay

## 2015-04-24 NOTE — Telephone Encounter (Signed)
Called pt no answer, LM informing pt of negative PAP

## 2015-04-24 NOTE — Telephone Encounter (Signed)
-----   Message from Hildred LaserAnika Cherry, MD sent at 04/20/2015  1:47 PM EST ----- Please inform patient of norma pap smear.

## 2015-05-06 ENCOUNTER — Ambulatory Visit (INDEPENDENT_AMBULATORY_CARE_PROVIDER_SITE_OTHER): Payer: Medicaid Other | Admitting: Obstetrics and Gynecology

## 2015-05-06 ENCOUNTER — Encounter: Payer: Self-pay | Admitting: Obstetrics and Gynecology

## 2015-05-06 VITALS — BP 149/73 | HR 102 | Wt 250.3 lb

## 2015-05-06 DIAGNOSIS — IMO0001 Reserved for inherently not codable concepts without codable children: Secondary | ICD-10-CM

## 2015-05-06 DIAGNOSIS — O358XX0 Maternal care for other (suspected) fetal abnormality and damage, not applicable or unspecified: Secondary | ICD-10-CM

## 2015-05-06 DIAGNOSIS — R7303 Prediabetes: Secondary | ICD-10-CM

## 2015-05-06 DIAGNOSIS — Z3482 Encounter for supervision of other normal pregnancy, second trimester: Secondary | ICD-10-CM

## 2015-05-06 DIAGNOSIS — Z3492 Encounter for supervision of normal pregnancy, unspecified, second trimester: Secondary | ICD-10-CM

## 2015-05-06 DIAGNOSIS — Z1379 Encounter for other screening for genetic and chromosomal anomalies: Secondary | ICD-10-CM

## 2015-05-06 LAB — POCT URINALYSIS DIPSTICK
Bilirubin, UA: NEGATIVE
Blood, UA: NEGATIVE
Glucose, UA: NEGATIVE
Ketones, UA: 80
Leukocytes, UA: NEGATIVE
NITRITE UA: NEGATIVE
UROBILINOGEN UA: NEGATIVE
pH, UA: 6

## 2015-05-06 NOTE — Progress Notes (Signed)
ROB: Patient notes URI symptoms, currently taking Sudafed (likely cause of elevated BP).  Missed 1st trimester NT scan visit.  Will perform 2nd trimester screen today.  Is completing early glucola due to risk factors of obesity and reported h/o pre-diabetes.  Missed [facility] consultation for h/o prior fetus with isolated congenital anomaly, needs to be rescheduled.  RTC in 4 weeks.  For anatomy scan at that time (unless earlier [facility]).

## 2015-05-07 LAB — GLUCOSE, 1 HOUR GESTATIONAL: Gestational Diabetes Screen: 220 mg/dL — ABNORMAL HIGH (ref 65–139)

## 2015-05-11 ENCOUNTER — Telehealth: Payer: Self-pay

## 2015-05-11 DIAGNOSIS — O24419 Gestational diabetes mellitus in pregnancy, unspecified control: Secondary | ICD-10-CM

## 2015-05-11 NOTE — Telephone Encounter (Signed)
-----   Message from Hildred LaserAnika Cherry, MD sent at 05/10/2015 12:14 PM EDT ----- Please inform patient that she is gestational diabetic based on elevated glucola.  Needs referral to [facility].

## 2015-05-11 NOTE — Telephone Encounter (Signed)
Called pt no answer, unable to leave message as no voicemail is set up. Will call again, referral placed.

## 2015-05-12 NOTE — Telephone Encounter (Signed)
Called pt, no answer, LM for pt to call back  

## 2015-05-14 NOTE — Telephone Encounter (Signed)
PT CALLED AND WAS RETURNING YOUR CALL SHE SAID TO CALL HER AT THIS NUMBER [phone], HER OTHER NUMBER IS NOT WORKING

## 2015-05-15 NOTE — Telephone Encounter (Signed)
Called number listed below. No answer. LM for pt.

## 2015-05-19 ENCOUNTER — Ambulatory Visit: Payer: Self-pay | Admitting: Dietician

## 2015-05-25 ENCOUNTER — Encounter: Payer: Self-pay | Admitting: Dietician

## 2015-05-25 ENCOUNTER — Telehealth: Payer: Self-pay | Admitting: Obstetrics and Gynecology

## 2015-05-25 ENCOUNTER — Encounter: Payer: Medicaid Other | Attending: Obstetrics and Gynecology | Admitting: Dietician

## 2015-05-25 VITALS — BP 100/72 | Ht 62.0 in | Wt 252.6 lb

## 2015-05-25 DIAGNOSIS — O24410 Gestational diabetes mellitus in pregnancy, diet controlled: Secondary | ICD-10-CM

## 2015-05-25 DIAGNOSIS — O24419 Gestational diabetes mellitus in pregnancy, unspecified control: Secondary | ICD-10-CM | POA: Diagnosis present

## 2015-05-25 MED ORDER — GLUCOSE BLOOD VI STRP: ORAL_STRIP | Status: DC

## 2015-05-25 MED ORDER — ACCU-CHEK FASTCLIX LANCETS MISC: Status: DC

## 2015-05-25 NOTE — Progress Notes (Signed)
Diabetes Self-Management Education  Visit Type: First/Initial  Appt. Start Time: 1015 Appt. End Time: 1145  05/25/2015  [name], identified by name and date of birth, is a 30 y.o. female with a diagnosis of Diabetes: Gestational Diabetes.   ASSESSMENT  Blood pressure 100/72, height 5\' 2"  (1.575 m), weight 252 lb 9.6 oz (114.579 kg), last menstrual period 01/08/2015. Body mass index is 46.19 kg/(m^2). Lacks knowledge of diabetes care and diet Is not checking BG's-does not have a BG meter     Diabetes Self-Management Education - 05/25/15 1323    Visit Information   Visit Type First/Initial   Initial Visit   Diabetes Type Gestational Diabetes   Health Coping   How would you rate your overall health? Fair   Psychosocial Assessment   Patient Concerns Weight Control  prevent complications, become more fit   Preferred Learning Style Visual;Auditory   Learning Readiness Contemplating   What is the last grade level you completed in school? 9   Complications   How often do you check your blood sugar? 0 times/day (not testing)   Have you had a dilated eye exam in the past 12 months? No  2 yr ago   Have you had a dental exam in the past 12 months? No  2 yr ago   Are you checking your feet? No   Dietary Intake   Breakfast --  eats 3 meals/day at 9:30am, 12:30pm, 6pm   Snack (morning) --  does not eat snacks   Beverage(s) --  Drinks 2-3 glasses of sweetened beverages /day and 2-3 glasses of diet drinks/day; drinks water 4-5x/day   Exercise   Exercise Type --  does no regular exercise   Patient Education   Previous Diabetes Education Yes (please comment)  when pregnant with first child and BG was slightly elevated   Disease state  --  pathophysiology of GDM and treatment options   Nutrition management  Role of diet in the treatment of diabetes and the relationship between the three main macronutrients and blood glucose level;Food label reading, portion sizes and  measuring food.;Carbohydrate counting   Physical activity and exercise  --  importance of exercise to promote BG control-encouraged to walk 20-30 min daily if permitted by MD   Medications --  reviewed medication options for treatment of GDM    Monitoring Taught/evaluated SMBG meter.;Purpose and frequency of SMBG.;Taught/discussed recording of test results and interpretation of SMBG.  gave pt Accuchek Aviva Plus meter and instructed on its use-BG 92 ac lunch   Acute complications Discussed and identified patients' treatment of hyperglycemia.   Chronic complications Relationship between chronic complications and blood glucose control   Psychosocial adjustment Role of stress on diabetes   Preconception care Pregnancy and GDM  Role of pre-pregnancy blood glucose control on the development of the fetus;Reviewed with patient blood glucose goals with pregnancy;Role of family planning for patients with diabetes   Personal strategies to promote health Lifestyle issues that need to be addressed for better diabetes care      Individualized Plan for Diabetes Self-Management Training:   Learning Objective:  Patient will have a greater understanding of diabetes self-management. Patient education plan is to attend individual and/or group sessions per assessed needs and concerns.   Plan:   Patient Instructions  Appt. Start Time: 1015 Appt. End Time:1145  GDM Class 1 Diabetes Overview - define DM; state own type of DM; identify functions of pancreas and insulin; define insulin deficiency vs insulin resistance  Psychosocial -  identify DM as a source of stress; state the effects of stress on BG control; verbalize appropriate stress management techniques; identify personal stress issues   Nutritional Management - describe effects of food on blood glucose; identify sources of carbohydrate, protein and fat; verbalize the importance of balance meals in controlling blood glucose; identify meals as well  balanced or not; estimate servings of carbohydrate from menus; use food labels to identify servings size, content of carbohydrate, fiber, protein, fat, saturated fat and sodium; recognize food sources of fat, saturated fat, trans fat, sodium and verbalize goals for intake; describe healthful appropriate food choices when dining out   Exercise - describe the effects of exercise on blood glucose and importance of regular exercise in controlling diabetes; state a plan for personal exercise; verbalize contraindications for exercise  Medications - state name, dose, timing of currently prescribed medications; describe types of medications available for diabetes  Insulin Training - prepare and administer insulin accurately; state correct rotation pattern; verbalize safe and lawful needle disposal  Self-Monitoring - state importance of HBGM and demo procedure accurately; use HBGM results to effectively manage diabetes; identify importance of regular HbA1C testing and goals for results  Acute Complications/Sick Day Guidelines - recognize hyperglycemia and hypoglycemia with causes and effects; identify blood glucose results as high, low or in control; list steps in treating and preventing high and low blood glucose; state appropriate measure to manage blood glucose when ill (need for meds, HBGM plan, when to call physician, need for fluids)  Chronic Complications/Foot, Skin, Eye Dental Care - identify possible long-term complications of diabetes (retinopathy, neuropathy, nephropathy, cardiovascular disease, infections); explain steps in prevention and treatment of chronic complications; state importance of daily self-foot exams; describe how to examine feet and what to look for; explain appropriate eye and dental care  Lifestyle Changes/Goals & Health/Community Resources - state benefits of making appropriate lifestyle changes; identify habits that need to change (meals, tobacco, alcohol); identify strategies to  reduce risk factors for personal health; set goals for proper diabetes care; state need for and frequency of healthcare follow-up; describe appropriate community resources for good health (ADA, web sites, apps)   Pregnancy/Sexual Health - define gestational diabetes; state importance of good blood glucose control and birth control prior to pregnancy; state importance of good blood glucose control in preventing sexual problems (impotence, vaginal dryness, infections, loss of desire); state relationship of blood glucose control and pregnancy outcome; describe risk of maternal and fetal complications  Teaching Materials Used: Meter-Accuchek Aviva Plus meter General Meal Planning Guidelines Daily Food Record Gestational Diabetes Booklet Gestational Video Goals for Healthy Pregnancy    Expected Outcomes:     Education material provided: General Meal Planning Guidelines, Accuchek Aviva Plus meter, GDM booklet, GDM video  If problems or questions, patient to contact team via:  [phone]  Future DSME appointment:  06-09-15

## 2015-05-25 NOTE — Patient Instructions (Signed)
Appt. Start Time: 1015 Appt. End Time:1145  GDM Class 1 Diabetes Overview - define DM; state own type of DM; identify functions of pancreas and insulin; define insulin deficiency vs insulin resistance  Psychosocial - identify DM as a source of stress; state the effects of stress on BG control; verbalize appropriate stress management techniques; identify personal stress issues   Nutritional Management - describe effects of food on blood glucose; identify sources of carbohydrate, protein and fat; verbalize the importance of balance meals in controlling blood glucose; identify meals as well balanced or not; estimate servings of carbohydrate from menus; use food labels to identify servings size, content of carbohydrate, fiber, protein, fat, saturated fat and sodium; recognize food sources of fat, saturated fat, trans fat, sodium and verbalize goals for intake; describe healthful appropriate food choices when dining out   Exercise - describe the effects of exercise on blood glucose and importance of regular exercise in controlling diabetes; state a plan for personal exercise; verbalize contraindications for exercise  Medications - state name, dose, timing of currently prescribed medications; describe types of medications available for diabetes  Insulin Training - prepare and administer insulin accurately; state correct rotation pattern; verbalize safe and lawful needle disposal  Self-Monitoring - state importance of HBGM and demo procedure accurately; use HBGM results to effectively manage diabetes; identify importance of regular HbA1C testing and goals for results  Acute Complications/Sick Day Guidelines - recognize hyperglycemia and hypoglycemia with causes and effects; identify blood glucose results as high, low or in control; list steps in treating and preventing high and low blood glucose; state appropriate measure to manage blood glucose when ill (need for meds, HBGM plan, when to call physician,  need for fluids)  Chronic Complications/Foot, Skin, Eye Dental Care - identify possible long-term complications of diabetes (retinopathy, neuropathy, nephropathy, cardiovascular disease, infections); explain steps in prevention and treatment of chronic complications; state importance of daily self-foot exams; describe how to examine feet and what to look for; explain appropriate eye and dental care  Lifestyle Changes/Goals & Health/Community Resources - state benefits of making appropriate lifestyle changes; identify habits that need to change (meals, tobacco, alcohol); identify strategies to reduce risk factors for personal health; set goals for proper diabetes care; state need for and frequency of healthcare follow-up; describe appropriate community resources for good health (ADA, web sites, apps)   Pregnancy/Sexual Health - define gestational diabetes; state importance of good blood glucose control and birth control prior to pregnancy; state importance of good blood glucose control in preventing sexual problems (impotence, vaginal dryness, infections, loss of desire); state relationship of blood glucose control and pregnancy outcome; describe risk of maternal and fetal complications  Teaching Materials Used: Meter-Accuchek Aviva Plus meter General Meal Planning Guidelines Daily Food Record Gestational Diabetes Booklet Gestational Video Goals for Healthy Pregnancy

## 2015-05-25 NOTE — Telephone Encounter (Signed)
[facility] AND NEED[facility] A RX FOR ACCU CHEK AVIVA PLUS TEST STRIPES AND ACCU CHEK FAST CLICK LANCETS. SHE USES [facility]

## 2015-05-25 NOTE — Telephone Encounter (Signed)
Called pt informed her that RX was sent in.

## 2015-06-03 ENCOUNTER — Ambulatory Visit (INDEPENDENT_AMBULATORY_CARE_PROVIDER_SITE_OTHER): Payer: Medicaid Other

## 2015-06-03 ENCOUNTER — Other Ambulatory Visit: Payer: Medicaid Other

## 2015-06-03 ENCOUNTER — Ambulatory Visit (INDEPENDENT_AMBULATORY_CARE_PROVIDER_SITE_OTHER): Payer: Medicaid Other | Admitting: Obstetrics and Gynecology

## 2015-06-03 VITALS — BP 125/86 | HR 90 | Wt 253.8 lb

## 2015-06-03 DIAGNOSIS — Z3482 Encounter for supervision of other normal pregnancy, second trimester: Secondary | ICD-10-CM

## 2015-06-03 DIAGNOSIS — Z720 Tobacco use: Secondary | ICD-10-CM

## 2015-06-03 DIAGNOSIS — O24410 Gestational diabetes mellitus in pregnancy, diet controlled: Secondary | ICD-10-CM

## 2015-06-03 DIAGNOSIS — Z3492 Encounter for supervision of normal pregnancy, unspecified, second trimester: Secondary | ICD-10-CM

## 2015-06-03 DIAGNOSIS — O24415 Gestational diabetes mellitus in pregnancy, controlled by oral hypoglycemic drugs: Secondary | ICD-10-CM | POA: Insufficient documentation

## 2015-06-03 LAB — POCT URINALYSIS DIPSTICK
Bilirubin, UA: NEGATIVE
Glucose, UA: NEGATIVE
KETONES UA: 15
LEUKOCYTES UA: NEGATIVE
NITRITE UA: NEGATIVE
PH UA: 6
RBC UA: NEGATIVE
Spec Grav, UA: >=1.03
Urobilinogen, UA: NEGATIVE

## 2015-06-03 NOTE — Progress Notes (Signed)
ROB: Patient denies major complaints.  2nd trimester screen normal. Had abnormal 1 hr early glucola (224).  Has gestational diabetes, seen in [facility].  Notes fasting glucose this morning was 119.  Notes postprandials have been 120s-140s.  Patient notes partial compliance with diet.  Reiterated stricter adherance to diet.  To f/u in 1 week for blood sugar review (did not bring log today).  Patient may likely need to be initiated on medications. Also [facility] (patient with prior fetus with isolated congenital anomaly).  May also need to be seen for diabetes as well.

## 2015-06-09 ENCOUNTER — Ambulatory Visit: Payer: Medicaid Other | Admitting: Dietician

## 2015-06-10 ENCOUNTER — Ambulatory Visit: Payer: Medicaid Other | Admitting: Dietician

## 2015-06-11 ENCOUNTER — Ambulatory Visit
Admission: RE | Admit: 2015-06-11 | Discharge: 2015-06-11 | Disposition: A | Discharge status: Home / Self Care | Payer: Medicaid Other | Source: Ambulatory Visit | Attending: Obstetrics & Gynecology | Admitting: Obstetrics & Gynecology

## 2015-06-11 VITALS — BP 118/59 | HR 96 | Temp 97.9°F | Resp 18 | Wt 252.6 lb

## 2015-06-11 DIAGNOSIS — Z8279 Family history of other congenital malformations, deformations and chromosomal abnormalities: Secondary | ICD-10-CM

## 2015-06-11 LAB — AFP, QUAD SCREEN
DIA MOM VALUE: 1.06
DIA Value (EIA): 139.55 pg/mL
DSR (BY AGE) 1 IN: 789
DSR (SECOND TRIMESTER) 1 IN: 1749
Gestational Age: 16.9 WEEKS
MATERNAL AGE AT EDD: 28.9 a
MSAFP MOM: 0.54
MSAFP: 16.2 ng/mL
MSHCG Mom: 0.84
MSHCG: 20749 m[IU]/mL
OSB RISK: 10000
PDF: 0
Test Results:: NEGATIVE
UE3 MOM: 0.74
UE3 VALUE: 0.67 ng/mL
Weight: 250 [lb_av]

## 2015-06-11 NOTE — Progress Notes (Addendum)
Referring physician:  [facility] 45 minute consultation  Ms. 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  This letter is a summary of our conversation.  We first obtained a detailed family history and pregnancy history.  The patient reported that this is her second pregnancy, the first with her current partner.  The father of the baby, Mikle BosworthCarlos, has a 29 year old son who is in good health.  The patient has an 29 year old daughter, Penne LashJamaya, from a prior relationship. 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  She had surgery after birth and is now in good health, with no other birth differences. She has had recurrent urinary tract infections and may soon meet with a urologist. Penne LashJamaya is currently in 3rd grade and is receiving special help with math and reading through an IEP.  No specific reason for her physical differences or learning concerns is known.  To her knowledge, Ms. Clovis RileyMitchell said that her daughter has not seen a medical genetics doctor for an evaluation.  The patient reported that she also had trouble with learning in school, particularly in reading.  Also in the family history, the patient reported that her mother has sickle cell trait.  No other family members were reported with birth defects, developmental differences, genetic conditions or recurrent pregnancy loss.  When a baby is born with physical birth defects, it is common to try to determine the cause for those differences and to understand if there may be common cause for the particular combination of birth defects seen in that child.  For [place], the combination of limb anomalies and anal atresia were seen.  Both conditions may be seen as an isolated birth defect or can be present as part of certain genetic syndromes.  One possible reason for limb and  anal birth defects in the same individual is known as VACTERL association.  VACTERL is an acronym for a combination of birth defects that is seen more often together than would be expected by chance.  This is the association of vertebral anomalies, anal atresia, cardiac defects, tracheoesophageal fistula, renal anomalies and limb abnormalities.  VACTERL most often occurs sporadically and would be expected to have a low recurrence risk in other family members.  Most persons with VACTERL, however, do not have developmental or learning differences.  It is possible that if Penne LashJamaya has learning differences, it could be unrelated, or she could have some other condition that would explain both the physical differences and the developmental concerns.  We talked about the possibility of a Medical Genetics consultation to assess her for possible genetic conditions that would explain this combination of findings.  Ms. Clovis RileyMitchell [facility] to [facility], so we provided her with the contact number for that clinic, [phone].  If she is seen in that clinic and any testing is performed that may give additional information for other family members, we are happy to discuss this with her at any time.  No current genetic testing is available to assess for VACTERL association, but ultrasound during the second trimester may be useful in evaluating the fetal anatomy for structural differences in the current pregnancy.  Ms. Clovis RileyMitchell had an anatomy ultrasound at [facility] at 20 weeks.  She is currently [redacted] weeks gestation.  We are happy to provide a detailed  anatomy ultrasound in the [facility] if desired due to this history or if any of the anatomy was not well visualized.  Please contact our office to schedule this if desired.  We also talked about screening for sickle cell trait given the history of the patient's mother said to be a carrier.  Sickle cell anemia is caused by a  change in the hemoglobin.  Hemoglobin is the substance in red blood cells that carries oxygen.  When there is a change in the structure of the hemoglobin, there are problems in the way the blood carries oxygen.  One specific change causes the cells to take on a sickle, or half moon, shape instead of the usual round shape.  This is called sickle cell anemia.  People with sickle cell disease are at an increased risk for infections, stroke, damage to certain organs, painful crises and other medical complications.   The changes that can occur in hemoglobin are caused by changes in the genetic instructions, or genes, that tell our bodies how to grow and develop.  We have two copies of all of our genes; one is inherited from the mother and one from the father.  Some diseases are caused when a gene is changed and does not function properly.  Recessive diseases are conditions that are caused when both copies of the gene for a trait do not function properly.  Sickle cell anemia is a recessive condition.  In recessive conditions, a person can have one changed copy of the gene and one normal copy and not have any medical problems.  This is because the normal copy masks the effects of the changed copy.  We call people who have one normal and one changed copy "carriers" for the trait.  Carriers can pass on either the normal copy or the changed copy when they make eggs or sperm. While sickle cell is the most common change in this gene, there are other differences including C trait, E trait or thalassemia which can be inherited along with sickle cell trait to cause a clinically significant condition.    Ms. Warrick was screened for sickle cell trait by her OB.  Her MCV is normal and the sickle cell solubility testing was normal.  This means that she is very unlikely to be a carrier of sickle cell trait (given the solubility) and unlikely to be a carrier for thalassemia (given the normal MCV).  However, solubility does NOT  screen for other hemoglobinopathies.  If more definitive testing is desired, hemoglobin fractionation would be recommended.  Lastly, we reviewed routine screening options.  Ms. Iseman had a maternal serum AFP quad screen preformed at [facility].  The results have become available since her visit and are within normal limits.  We encouraged Ms. Rebman to contact us at [phone] with any questions.  Katrina Stack CGC  Freddie Nghiem, [place] A, MD

## 2015-06-18 ENCOUNTER — Ambulatory Visit (INDEPENDENT_AMBULATORY_CARE_PROVIDER_SITE_OTHER): Payer: Medicaid Other | Admitting: Obstetrics and Gynecology

## 2015-06-18 VITALS — BP 108/80 | HR 95 | Wt 255.2 lb

## 2015-06-18 DIAGNOSIS — O24410 Gestational diabetes mellitus in pregnancy, diet controlled: Secondary | ICD-10-CM

## 2015-06-18 LAB — GLUCOSE, POCT (MANUAL RESULT ENTRY): POC Glucose: 123 mg/dl — AB (ref 70–99)

## 2015-06-18 MED ORDER — GLYBURIDE 5 MG PO TABS: 5.0000 mg | ORAL_TABLET | Freq: Two times a day (BID) | ORAL | Status: DC

## 2015-06-18 NOTE — Progress Notes (Signed)
Pt here to have finger stick glucose. FSBS results 123. Pt record of blood sugars copied. Pt also states she likes cereal for breakfast but is told it has too much sugar. I encouraged pt to read labels for all foods for sugar content.  Has also been out of work due to upper abdomen pain and lower abdomen pain on sides when working. She lifts boxes over her head and bends over to pick them up. Pt states boxes are not heavy. Stands for 8hrs day a work.  Pt wanted a note to excuse her from work yesterday and today. I spoke with Dr. Valentino Saxonherry and she reviewed blood sugars. Pt to start Glyburide 5mg  po in am and pm. Hypoglycemic precautions given. Also may give pt work modification note but unable to give sick note because pt not seen by provider.

## 2015-06-20 NOTE — Progress Notes (Signed)
I have reviewed the record and concur with patient management and plan as documented by Fenton Mallingebbie Ridgeway, LPN.  Patient presented for nurse visit, multiple elevated fastings (range 80s-130s) and postprandials (90s-150s) to begin Glyburide 5 mg BID.  Continue to encourage better compliance with diet.

## 2015-06-22 ENCOUNTER — Telehealth: Payer: Self-pay | Admitting: Obstetrics and Gynecology

## 2015-06-22 ENCOUNTER — Encounter: Payer: Self-pay | Admitting: Dietician

## 2015-06-22 ENCOUNTER — Encounter: Payer: Medicaid Other | Attending: Obstetrics and Gynecology | Admitting: Dietician

## 2015-06-22 VITALS — BP 100/58 | Ht 62.0 in | Wt 256.2 lb

## 2015-06-22 DIAGNOSIS — O24419 Gestational diabetes mellitus in pregnancy, unspecified control: Secondary | ICD-10-CM | POA: Insufficient documentation

## 2015-06-22 DIAGNOSIS — O24410 Gestational diabetes mellitus in pregnancy, diet controlled: Secondary | ICD-10-CM

## 2015-06-22 NOTE — Telephone Encounter (Signed)
Hey [name] Wolf [name] Wolf called and wanted to know when her next appt was. There was not an appt so I had to go to her chart and figure it out. She is to come Q week to get her blood sugar checked. And then Dr. Valentino Saxonherry said on her visit on 4/12 to come back in 4 wks for Ob visit. That would fall on 5/10. Will you look at that day or whenever to see when she can come back.  [name] OctaveDarmesha is coming Thursday for BS check.

## 2015-06-22 NOTE — Progress Notes (Signed)
   Patient's BG record indicates BGs are fluctuating; fasting BGs are ranging 95-124 since starting Glyburide, and post-meal BGs are ranging 90-188 (no readings since starting Glyburide).   Patient's food diary indicates: did not have a diary. Verbal diet history indicates some varying carbohydate intake.  She reports being too full to eat snacks during the day, but does occasionally eat a snack.     Some meals, particularly supper meals, might be low in carbohydrate based on patient's verbal report.  Provided  1800kcal meal plan, and wrote individualized menus based on patient's food preferences.  Instructed patient on food safety, including avoidance of Listeriosis, and limiting mercury from fish.  Discussed importance of maintaining healthy lifestyle habits to reduce risk of Type 2 DM as well as Gestational DM with any future pregnancies.  Advised patient to use any remaining testing supplies to test some BGs after delivery, and to have BG tested ideally annually, as well as prior to attempting future pregnancies.

## 2015-06-22 NOTE — Patient Instructions (Signed)
   Continue to eat 3 meals daily, but make sure to keep carb intake to 2-3 servings with each meal.   Make sure to eat something every 3-5 hours, adding snacks as needed when hungry.   Continue with regular exercise, great job!

## 2015-06-23 NOTE — Telephone Encounter (Signed)
Pt can come 07/01/15 at 2:15 or 2:45pm. Thanks

## 2015-06-25 ENCOUNTER — Other Ambulatory Visit: Payer: Medicaid Other

## 2015-06-29 NOTE — Addendum Note (Signed)
Encounter addended by: [place] Bronislaw Switzer, MD on: 06/29/2015 12:55 PM<BR>     Documentation filed: Notes Section, Problem List

## 2015-07-01 ENCOUNTER — Ambulatory Visit (INDEPENDENT_AMBULATORY_CARE_PROVIDER_SITE_OTHER): Payer: Medicaid Other | Admitting: Obstetrics and Gynecology

## 2015-07-01 ENCOUNTER — Encounter: Payer: Self-pay | Admitting: Obstetrics and Gynecology

## 2015-07-01 VITALS — BP 104/69 | HR 104 | Wt 254.2 lb

## 2015-07-01 DIAGNOSIS — Z1389 Encounter for screening for other disorder: Secondary | ICD-10-CM

## 2015-07-01 DIAGNOSIS — Z331 Pregnant state, incidental: Secondary | ICD-10-CM

## 2015-07-01 DIAGNOSIS — O24415 Gestational diabetes mellitus in pregnancy, controlled by oral hypoglycemic drugs: Secondary | ICD-10-CM

## 2015-07-01 DIAGNOSIS — Z3490 Encounter for supervision of normal pregnancy, unspecified, unspecified trimester: Secondary | ICD-10-CM

## 2015-07-01 LAB — POCT URINALYSIS DIPSTICK
BILIRUBIN UA: NEGATIVE
Blood, UA: NEGATIVE
KETONES UA: NEGATIVE
LEUKOCYTES UA: NEGATIVE
Nitrite, UA: NEGATIVE
PROTEIN UA: NEGATIVE
SPEC GRAV UA: 1.025
Urobilinogen, UA: NEGATIVE
pH, UA: 6

## 2015-07-01 LAB — GLUCOSE, POCT (MANUAL RESULT ENTRY): POC Glucose: 112 mg/dl — AB (ref 70–99)

## 2015-07-01 NOTE — Progress Notes (Signed)
Patient presents for blood sugar check after initiation of meds (Glyburide 5 mg daily). Patient still noting non-compliance with diet (notes her mother told her that her carb intake was not high enough).  Blood sugars with fastings 92-138, postprandials 77-182 (also several values missing). 4+ glucosuria noted today.  Reiterated effects of diabetes on pregnancy, strongly encouraged better compliance with diet.  Also, patient encouraged to bring glucose meter to every visit. Discussed need for antenatal testing and growth scans beginning at 32 weeks. To f/u in 1 week for glucose check.

## 2015-07-01 NOTE — Progress Notes (Signed)
Encourage compliance of diabetes monitoring and diet. Pt not following finger sticks or diet. Mom told her that 13g carbs was not enough.

## 2015-07-08 ENCOUNTER — Ambulatory Visit (INDEPENDENT_AMBULATORY_CARE_PROVIDER_SITE_OTHER): Payer: Medicaid Other | Admitting: Obstetrics and Gynecology

## 2015-07-08 ENCOUNTER — Encounter: Payer: Self-pay | Admitting: Obstetrics and Gynecology

## 2015-07-08 VITALS — BP 112/82 | HR 98 | Wt 256.0 lb

## 2015-07-08 DIAGNOSIS — O0992 Supervision of high risk pregnancy, unspecified, second trimester: Secondary | ICD-10-CM

## 2015-07-08 DIAGNOSIS — O24419 Gestational diabetes mellitus in pregnancy, unspecified control: Secondary | ICD-10-CM

## 2015-07-08 LAB — POCT URINALYSIS DIPSTICK
Blood, UA: NEGATIVE
Glucose, UA: NEGATIVE
Ketones, UA: 5
Leukocytes, UA: NEGATIVE
Nitrite, UA: NEGATIVE
Spec Grav, UA: 1.025
Urobilinogen, UA: 0.2
pH, UA: 6

## 2015-07-08 NOTE — Progress Notes (Signed)
ROB- pt denies any new complaints 

## 2015-07-08 NOTE — Progress Notes (Signed)
ROB: Patient returns for glucose log check and OB visit.  Notes attempting to achieve better compliance with diet. Currently on 5 mg glyburide BID.  Blood sugars reviewed, still with several fastings above 90 (74-151), and 2hr postprandials above 130 (especially 2 hrs after breakfast with ranges of 112-163, only 1 elevated above 130 after lunch, and all below 130 after dinner). Sugar log scanned in. Increased Glyburide to 7.5 mg BID.  To f/u in 1 week for review. Continue to encourage better compliance with diet.

## 2015-07-15 ENCOUNTER — Other Ambulatory Visit: Payer: Medicaid Other

## 2015-07-16 ENCOUNTER — Other Ambulatory Visit: Payer: Medicaid Other

## 2015-07-22 ENCOUNTER — Other Ambulatory Visit (INDEPENDENT_AMBULATORY_CARE_PROVIDER_SITE_OTHER): Payer: Medicaid Other | Admitting: Obstetrics and Gynecology

## 2015-07-22 VITALS — BP 92/70 | HR 101 | Wt 261.4 lb

## 2015-07-22 DIAGNOSIS — Z3490 Encounter for supervision of normal pregnancy, unspecified, unspecified trimester: Secondary | ICD-10-CM

## 2015-07-22 DIAGNOSIS — Z3483 Encounter for supervision of other normal pregnancy, third trimester: Secondary | ICD-10-CM

## 2015-07-22 DIAGNOSIS — Z331 Pregnant state, incidental: Secondary | ICD-10-CM

## 2015-07-22 DIAGNOSIS — O24419 Gestational diabetes mellitus in pregnancy, unspecified control: Secondary | ICD-10-CM

## 2015-07-22 DIAGNOSIS — Z1389 Encounter for screening for other disorder: Secondary | ICD-10-CM

## 2015-07-22 LAB — POCT URINALYSIS DIPSTICK
BILIRUBIN UA: NEGATIVE
GLUCOSE UA: NEGATIVE
Ketones, UA: NEGATIVE
Leukocytes, UA: NEGATIVE
NITRITE UA: NEGATIVE
Protein, UA: NEGATIVE
RBC UA: NEGATIVE
Spec Grav, UA: 1.025
Urobilinogen, UA: NEGATIVE
pH, UA: 6

## 2015-07-22 LAB — GLUCOSE, POCT (MANUAL RESULT ENTRY): POC GLUCOSE: 164 mg/dL — AB (ref 70–99)

## 2015-07-22 MED ORDER — GLYBURIDE 5 MG PO TABS: 10.0000 mg | ORAL_TABLET | Freq: Two times a day (BID) | ORAL | Status: DC

## 2015-07-22 NOTE — Progress Notes (Signed)
Patient ID: [name] Wolf, female   DOB: 08/15/1985, 30 y.o.   MRN: [id number] Pt presents for FBS for gestational diabetes. FBS results: 164. Pt states she had a sausage McMuffin this am. Blood sugar ranges recorded from home are 71-143.  Dr. Valentino Saxonherry informed and increased Glyburide 5mg ;  1.5 tabs to 2 tabs, twice daily and if blood sugars continue to be 140 range next visit she will need to go on insulin. Pt encouraged to adhere to diet and checking blood sugars. Will f/u in 1 wk.

## 2015-07-30 ENCOUNTER — Encounter: Payer: Medicaid Other | Admitting: Obstetrics and Gynecology

## 2015-08-04 ENCOUNTER — Encounter: Payer: Medicaid Other | Admitting: Obstetrics and Gynecology

## 2015-08-11 ENCOUNTER — Encounter: Payer: Medicaid Other | Admitting: Obstetrics and Gynecology

## 2015-08-13 ENCOUNTER — Ambulatory Visit (INDEPENDENT_AMBULATORY_CARE_PROVIDER_SITE_OTHER): Payer: Medicaid Other | Admitting: Obstetrics and Gynecology

## 2015-08-13 VITALS — BP 128/80 | HR 90 | Wt 261.3 lb

## 2015-08-13 DIAGNOSIS — O24410 Gestational diabetes mellitus in pregnancy, diet controlled: Secondary | ICD-10-CM

## 2015-08-13 DIAGNOSIS — Z8279 Family history of other congenital malformations, deformations and chromosomal abnormalities: Secondary | ICD-10-CM

## 2015-08-13 DIAGNOSIS — O0993 Supervision of high risk pregnancy, unspecified, third trimester: Secondary | ICD-10-CM

## 2015-08-13 LAB — POCT URINALYSIS DIPSTICK
Bilirubin, UA: NEGATIVE
Blood, UA: NEGATIVE
Glucose, UA: NEGATIVE
Ketones, UA: 40
Leukocytes, UA: NEGATIVE
Nitrite, UA: NEGATIVE
Spec Grav, UA: 1.025
Urobilinogen, UA: NEGATIVE
pH, UA: 6

## 2015-08-13 NOTE — Progress Notes (Signed)
ROB:  Patient returns for OB visit and BS log check.  Weight gain of 5 lbs since 07/08/15 was discussed with patient.  Pt states that she does not track her caloric intake (i.e., food diary).  Encouraged better compliance with diet  Blood sugars reviewed, fasting ranging from 53-100, and 2hr postprandials ranges 60-105.  Sugar log scanned in.  Currently on Glyburide 10mg  BID.   Will need growth scan in 1 week, and begin twice weekly NSTs.  RTC in 2 weeks.

## 2015-08-13 NOTE — Progress Notes (Signed)
I have seen, interviewed, and examined the patient in conjunction with the [facility] Pushpinder Delane GingerGill and affirm the diagnosis and management plan.   Hildred LaserAnika Colan Laymon, MD [facility]

## 2015-08-18 ENCOUNTER — Ambulatory Visit (INDEPENDENT_AMBULATORY_CARE_PROVIDER_SITE_OTHER): Payer: Medicaid Other | Admitting: Obstetrics and Gynecology

## 2015-08-18 VITALS — BP 116/65 | HR 104 | Wt 263.1 lb

## 2015-08-18 DIAGNOSIS — O0993 Supervision of high risk pregnancy, unspecified, third trimester: Secondary | ICD-10-CM

## 2015-08-18 DIAGNOSIS — O24415 Gestational diabetes mellitus in pregnancy, controlled by oral hypoglycemic drugs: Secondary | ICD-10-CM | POA: Diagnosis not present

## 2015-08-18 DIAGNOSIS — R809 Proteinuria, unspecified: Secondary | ICD-10-CM

## 2015-08-18 DIAGNOSIS — Z1389 Encounter for screening for other disorder: Secondary | ICD-10-CM

## 2015-08-18 NOTE — Progress Notes (Signed)
NONSTRESS TEST INTERPRETATION  INDICATIONS: GDM; OBESITY  FHR baseline: 140 RESULTS: REACTIVE COMMENTS: BABY VERY ACTIVE   PLAN: 1. Continue fetal kick counts twice a day. 2. Continue antepartum testing as scheduled-Biweekly   Fenton Mallingebbie Valaree Fresquez, LPN

## 2015-08-18 NOTE — Progress Notes (Signed)
NST performed today was reviewed and was found to be reactive.  Continue recommended antenatal testing and prenatal care.  

## 2015-08-19 NOTE — Addendum Note (Signed)
Addended by: Jackquline DenmarkIDGEWAY, Marthena Whitmyer W on: 08/19/2015 04:31 PM   Modules accepted: Orders

## 2015-08-20 ENCOUNTER — Other Ambulatory Visit: Payer: Self-pay | Admitting: Obstetrics and Gynecology

## 2015-08-21 ENCOUNTER — Ambulatory Visit (INDEPENDENT_AMBULATORY_CARE_PROVIDER_SITE_OTHER): Payer: Medicaid Other

## 2015-08-21 ENCOUNTER — Ambulatory Visit (INDEPENDENT_AMBULATORY_CARE_PROVIDER_SITE_OTHER): Payer: Medicaid Other | Admitting: Obstetrics and Gynecology

## 2015-08-21 VITALS — BP 125/70 | HR 104 | Wt 262.4 lb

## 2015-08-21 DIAGNOSIS — O0993 Supervision of high risk pregnancy, unspecified, third trimester: Secondary | ICD-10-CM | POA: Diagnosis not present

## 2015-08-21 DIAGNOSIS — O24419 Gestational diabetes mellitus in pregnancy, unspecified control: Secondary | ICD-10-CM | POA: Diagnosis not present

## 2015-08-21 DIAGNOSIS — E669 Obesity, unspecified: Secondary | ICD-10-CM | POA: Diagnosis not present

## 2015-08-21 DIAGNOSIS — O24415 Gestational diabetes mellitus in pregnancy, controlled by oral hypoglycemic drugs: Secondary | ICD-10-CM

## 2015-08-21 DIAGNOSIS — O24410 Gestational diabetes mellitus in pregnancy, diet controlled: Secondary | ICD-10-CM

## 2015-08-21 LAB — URINE CULTURE

## 2015-08-21 NOTE — Progress Notes (Signed)
NONSTRESS TEST INTERPRETATION  INDICATIONS: GDM, OBESITY  FHR baseline: 130 RESULTS: reactive COMMENTS: pt went to sleep the last 5 minutes. Pt states she is unable to void. Voided before she left home.   PLAN: 1. Continue fetal kick counts twice a day. 2. Continue antepartum testing as scheduled-Biweekly   Fenton Mallingebbie Tymel Conely, LPN

## 2015-08-24 ENCOUNTER — Other Ambulatory Visit: Payer: Medicaid Other

## 2015-08-27 ENCOUNTER — Other Ambulatory Visit: Payer: Medicaid Other

## 2015-08-27 ENCOUNTER — Encounter: Payer: Self-pay | Admitting: Obstetrics and Gynecology

## 2015-08-27 ENCOUNTER — Ambulatory Visit (INDEPENDENT_AMBULATORY_CARE_PROVIDER_SITE_OTHER): Payer: Medicaid Other | Admitting: Obstetrics and Gynecology

## 2015-08-27 DIAGNOSIS — Z369 Encounter for antenatal screening, unspecified: Secondary | ICD-10-CM

## 2015-08-27 DIAGNOSIS — O24415 Gestational diabetes mellitus in pregnancy, controlled by oral hypoglycemic drugs: Secondary | ICD-10-CM

## 2015-08-27 DIAGNOSIS — Z36 Encounter for antenatal screening of mother: Secondary | ICD-10-CM

## 2015-08-27 DIAGNOSIS — O0993 Supervision of high risk pregnancy, unspecified, third trimester: Secondary | ICD-10-CM

## 2015-08-27 DIAGNOSIS — Z1389 Encounter for screening for other disorder: Secondary | ICD-10-CM

## 2015-08-27 LAB — POCT URINALYSIS DIPSTICK
Bilirubin, UA: NEGATIVE
NITRITE UA: NEGATIVE
PH UA: 6
PROTEIN UA: NEGATIVE
RBC UA: NEGATIVE
Spec Grav, UA: 1.025
UROBILINOGEN UA: NEGATIVE

## 2015-08-27 NOTE — Progress Notes (Signed)
NONSTRESS TEST INTERPRETATION  INDICATIONS: GDM, Obesity  FHR baseline: 140 RESULTS: reactive  COMMENTS:      PLAN: 1. Continue fetal kick counts twice a day. 2. Continue antepartum testing as scheduled-Biweekly  Fenton Mallingebbie Stark Aguinaga, LPN

## 2015-08-28 ENCOUNTER — Other Ambulatory Visit: Payer: Medicaid Other

## 2015-08-30 NOTE — Progress Notes (Signed)
ROB: Patient denies complaints. Notes that blood sugars "mostly well controlled" with the occasional elevation with fasting and after lunch.  Did not bring glucose log.  +4 glucosuria noted.  Has begun twice weekly NSTs, today's NST reactive.  S/p growth scan which notes borderline high AFI (21.3 cm, between 50-95%ile) and normal growth (53%ile).  To continue Glyburide 10 mg BID.  Patient needs to return next week for glucose log review and f/u NST. If sugars still elevated, will [facility] of insulin.  RTC in 2 weeks for OB visit.  Continued to advise better diet compliance.

## 2015-09-01 ENCOUNTER — Other Ambulatory Visit: Payer: Medicaid Other

## 2015-09-04 ENCOUNTER — Other Ambulatory Visit: Payer: Medicaid Other

## 2015-09-07 ENCOUNTER — Other Ambulatory Visit: Payer: Medicaid Other

## 2015-09-08 ENCOUNTER — Ambulatory Visit (INDEPENDENT_AMBULATORY_CARE_PROVIDER_SITE_OTHER): Payer: Medicaid Other | Admitting: Obstetrics and Gynecology

## 2015-09-08 VITALS — BP 114/67 | HR 130 | Wt 265.6 lb

## 2015-09-08 DIAGNOSIS — Z36 Encounter for antenatal screening of mother: Secondary | ICD-10-CM

## 2015-09-08 DIAGNOSIS — O0993 Supervision of high risk pregnancy, unspecified, third trimester: Secondary | ICD-10-CM

## 2015-09-08 DIAGNOSIS — O24415 Gestational diabetes mellitus in pregnancy, controlled by oral hypoglycemic drugs: Secondary | ICD-10-CM | POA: Diagnosis not present

## 2015-09-08 DIAGNOSIS — Z369 Encounter for antenatal screening, unspecified: Secondary | ICD-10-CM

## 2015-09-08 DIAGNOSIS — Z1389 Encounter for screening for other disorder: Secondary | ICD-10-CM

## 2015-09-08 LAB — POCT URINALYSIS DIPSTICK
BILIRUBIN UA: NEGATIVE
GLUCOSE UA: NEGATIVE
KETONES UA: NEGATIVE
LEUKOCYTES UA: NEGATIVE
NITRITE UA: NEGATIVE
RBC UA: NEGATIVE
Spec Grav, UA: 1.025
Urobilinogen, UA: 0.2
pH, UA: 6.5

## 2015-09-08 NOTE — Progress Notes (Signed)
NST performed today was reviewed and was found to be reactive.  Continue recommended antenatal testing and prenatal care.  

## 2015-09-08 NOTE — Progress Notes (Signed)
NONSTRESS TEST INTERPRETATION  INDICATIONS:  GDM, Obesity   FHR baseline: 140 RESULTS: reactive COMMENTS: pt c/o upper abdominal pain, in one area and crampy of lower abdominal area and some aches of right hip area.  Pt informed this may be braxton hicks contraction of upper abdomen, will check urine for UTI, and other pain could be related to baby growing. Urinalysis resulted in trace protein, other values were within normal limits. Pulse 130 with first set of vital signs, down to 96, 2nd time. No c/o shortness of breath, chest pain, or dizziness.   PLAN: 1. Continue fetal kick counts twice a day. 2. Continue antepartum testing as scheduled-Biweekly 3.   Fenton Mallingebbie Cordarious Zeek, LPN

## 2015-09-11 ENCOUNTER — Other Ambulatory Visit: Payer: Medicaid Other

## 2015-09-14 ENCOUNTER — Other Ambulatory Visit: Payer: Medicaid Other

## 2015-09-15 ENCOUNTER — Ambulatory Visit (INDEPENDENT_AMBULATORY_CARE_PROVIDER_SITE_OTHER): Payer: Medicaid Other | Admitting: Obstetrics and Gynecology

## 2015-09-15 VITALS — BP 129/60 | HR 89 | Wt 271.0 lb

## 2015-09-15 DIAGNOSIS — O0992 Supervision of high risk pregnancy, unspecified, second trimester: Secondary | ICD-10-CM

## 2015-09-15 DIAGNOSIS — Z1389 Encounter for screening for other disorder: Secondary | ICD-10-CM

## 2015-09-15 DIAGNOSIS — O24415 Gestational diabetes mellitus in pregnancy, controlled by oral hypoglycemic drugs: Secondary | ICD-10-CM | POA: Diagnosis not present

## 2015-09-15 DIAGNOSIS — O0993 Supervision of high risk pregnancy, unspecified, third trimester: Secondary | ICD-10-CM

## 2015-09-15 DIAGNOSIS — Z369 Encounter for antenatal screening, unspecified: Secondary | ICD-10-CM

## 2015-09-15 DIAGNOSIS — Z36 Encounter for antenatal screening of mother: Secondary | ICD-10-CM

## 2015-09-15 LAB — POCT URINALYSIS DIPSTICK
Bilirubin, UA: NEGATIVE
Glucose, UA: NEGATIVE
Ketones, UA: NEGATIVE
Leukocytes, UA: NEGATIVE
Nitrite, UA: NEGATIVE
PH UA: 6
RBC UA: NEGATIVE
SPEC GRAV UA: 1.025
UROBILINOGEN UA: 0.2

## 2015-09-15 NOTE — Progress Notes (Signed)
NONSTRESS TEST INTERPRETATION  INDICATIONS: GDM, Obesity  FHR baseline: 130 RESULTS: reactive COMMENTS: B/P'S during NST's: B/P-90/70, P-77; B/P-109/64, P-85   PLAN: 1. Continue fetal kick counts twice a day. 2. Continue antepartum testing as scheduled-Biweekly   Fenton Malling, LPN

## 2015-09-15 NOTE — Progress Notes (Signed)
NST performed today was reviewed and was found to be reactive.  Continue recommended antenatal testing and prenatal care.  

## 2015-09-18 ENCOUNTER — Other Ambulatory Visit: Payer: Self-pay | Admitting: Obstetrics and Gynecology

## 2015-09-18 ENCOUNTER — Other Ambulatory Visit: Payer: Medicaid Other

## 2015-09-18 DIAGNOSIS — O24419 Gestational diabetes mellitus in pregnancy, unspecified control: Secondary | ICD-10-CM

## 2015-09-22 ENCOUNTER — Encounter: Payer: Self-pay | Admitting: Obstetrics and Gynecology

## 2015-09-22 ENCOUNTER — Ambulatory Visit (INDEPENDENT_AMBULATORY_CARE_PROVIDER_SITE_OTHER): Payer: Medicaid Other | Admitting: Obstetrics and Gynecology

## 2015-09-22 ENCOUNTER — Ambulatory Visit (INDEPENDENT_AMBULATORY_CARE_PROVIDER_SITE_OTHER): Payer: Medicaid Other

## 2015-09-22 ENCOUNTER — Ambulatory Visit: Payer: Medicaid Other

## 2015-09-22 VITALS — BP 137/73 | HR 73 | Wt 277.0 lb

## 2015-09-22 DIAGNOSIS — Z369 Encounter for antenatal screening, unspecified: Secondary | ICD-10-CM

## 2015-09-22 DIAGNOSIS — O0993 Supervision of high risk pregnancy, unspecified, third trimester: Secondary | ICD-10-CM

## 2015-09-22 DIAGNOSIS — Z1389 Encounter for screening for other disorder: Secondary | ICD-10-CM

## 2015-09-22 DIAGNOSIS — Z23 Encounter for immunization: Secondary | ICD-10-CM

## 2015-09-22 DIAGNOSIS — Z36 Encounter for antenatal screening of mother: Secondary | ICD-10-CM

## 2015-09-22 DIAGNOSIS — O24419 Gestational diabetes mellitus in pregnancy, unspecified control: Secondary | ICD-10-CM

## 2015-09-22 DIAGNOSIS — O24415 Gestational diabetes mellitus in pregnancy, controlled by oral hypoglycemic drugs: Secondary | ICD-10-CM

## 2015-09-22 LAB — POCT URINALYSIS DIPSTICK
Bilirubin, UA: NEGATIVE
GLUCOSE UA: NEGATIVE
Ketones, UA: NEGATIVE
LEUKOCYTES UA: NEGATIVE
NITRITE UA: NEGATIVE
Protein, UA: NEGATIVE
RBC UA: NEGATIVE
Spec Grav, UA: 1.01
UROBILINOGEN UA: NEGATIVE
pH, UA: 6

## 2015-09-22 MED ORDER — TETANUS-DIPHTH-ACELL PERTUSSIS 5-2.5-18.5 LF-MCG/0.5 IM SUSP
0.5000 mL | Freq: Once | INTRAMUSCULAR | Status: AC
  Administered 2015-09-22: 0.5 mL via INTRAMUSCULAR

## 2015-09-22 NOTE — Progress Notes (Signed)
ROB: Patient notes blood sugars are mostly controlled, still notes occasional fasting above 100.  Discussed need for IOL at 39 weeks.  For Tdap today, discussed cord blood banking, blood consent signed.  To be scheduled for IOL on 10/09/15.  For 36 week labs next visit (declined today as she notes her ride is waiting). RTC in 1 week. Continue NSTs twice weekly.  Will start at hospital as difficulty in tracing. NST performed today, indeterminate due to active movement.    NONSTRESS TEST INTERPRETATION  INDICATIONS: Obesity and Gestational Diabetes (on Glyburide)  FHR baseline: 140 RESULTS:Inconclusive  (excessive fetal movement, unable to adequately trace fetus) COMMENTS: occasional contraction noted.   PLAN: 1. Continue fetal kick counts twice a day. 2. Continue antepartum testing as scheduled-Biweekly.  Will begin scheduling at hospital.

## 2015-09-24 ENCOUNTER — Telehealth: Payer: Self-pay

## 2015-09-24 NOTE — Telephone Encounter (Signed)
[name] Wolf, [name] Wolf Female, 29 y.o., Jul 14, 1985 Weight:  277 lb (125.6 kg) Phone:  [phone] PCP:  Hildred Laser, MD FYI Designated Party Release  MRN:  [id number] MyChart:  Pending Next Appt:  09/25/2015 NST  Received: Burgess Estelle  Message Contents  Hildred Laser, MD  Fenton Malling, LPN        Please contact patient and inform her that we have scheduled her next NST on Friday at the hospital at 2 pm due to difficulty performing in office.    Phone #[phone] no longer in service. Pt reached at [phone]. Pt informed of NST scheduled at hospital at 2pm on Friday 09/25/15.  Appointment here canceled.

## 2015-09-25 ENCOUNTER — Observation Stay
Admission: RE | Admit: 2015-09-25 | Payer: Medicaid Other | Source: Ambulatory Visit | Admitting: Obstetrics and Gynecology

## 2015-09-25 ENCOUNTER — Other Ambulatory Visit: Payer: Medicaid Other

## 2015-09-25 ENCOUNTER — Observation Stay
Admission: RE | Admit: 2015-09-25 | Discharge: 2015-09-25 | Disposition: A | Discharge status: Home / Self Care | Payer: Medicaid Other | Source: Ambulatory Visit | Attending: Obstetrics and Gynecology | Admitting: Obstetrics and Gynecology

## 2015-09-25 DIAGNOSIS — E669 Obesity, unspecified: Secondary | ICD-10-CM | POA: Diagnosis not present

## 2015-09-25 DIAGNOSIS — O24415 Gestational diabetes mellitus in pregnancy, controlled by oral hypoglycemic drugs: Secondary | ICD-10-CM | POA: Diagnosis not present

## 2015-09-25 DIAGNOSIS — Z6841 Body Mass Index (BMI) 40.0 and over, adult: Secondary | ICD-10-CM | POA: Diagnosis not present

## 2015-09-25 DIAGNOSIS — O99213 Obesity complicating pregnancy, third trimester: Secondary | ICD-10-CM | POA: Diagnosis not present

## 2015-09-25 DIAGNOSIS — Z3A37 37 weeks gestation of pregnancy: Secondary | ICD-10-CM | POA: Insufficient documentation

## 2015-09-25 NOTE — Progress Notes (Signed)
Off monitor.  Up to dress for discharge.

## 2015-09-25 NOTE — Progress Notes (Signed)
Discharge instructions given and explained.  Verbalized understanding.  Signed copy on chart and one in hand.  

## 2015-09-25 NOTE — OB Triage Note (Signed)
Present for fetal testing - NST.

## 2015-09-28 NOTE — Final Progress Note (Signed)
L&D OB Triage Note  [name] Wolf is a 30 y.o. G2P1001 female at 1534w1d, EDD Estimated Date of Delivery: 10/15/15 who presented to triage for scheduled NST for GDM on Glyburide and obesity.   Vital signs stable. An NST was performed and has been reviewed by MD.    Physical Exam:  Blood pressure 97/61, pulse 97, temperature 98.6 F (37 C), temperature source Oral, resp. rate 16, height 5\' 2"  (1.575 m), weight 277 lb (125.6 kg), last menstrual period 01/08/2015.   NST INTERPRETATION: Indications: gestational diabetes mellitus and obesity  Mode: External Baseline Rate (A): 135 bpm Variability: Moderate Accelerations: 15 x 15 Decelerations: None Nonstress Test Interpretation: Reactive Overall Impression: Reassuring for gestational age Contraction Frequency (min): Occas  Impression: reactive   Plan: NST performed was reviewed and was found to be reactive. She was discharged home with bleeding/labor precautions.  Continue routine prenatal care. Patient to continue weekly NSTs (patient is supposed to get twice weekly, however notes difficulties with transportation). Follow up with OB/GYN as previously scheduled.     Hildred LaserAnika Zaydyn Havey, MD

## 2015-09-29 ENCOUNTER — Ambulatory Visit (INDEPENDENT_AMBULATORY_CARE_PROVIDER_SITE_OTHER): Payer: Medicaid Other | Admitting: Obstetrics and Gynecology

## 2015-09-29 VITALS — BP 129/61 | HR 100 | Wt 275.7 lb

## 2015-09-29 DIAGNOSIS — O24415 Gestational diabetes mellitus in pregnancy, controlled by oral hypoglycemic drugs: Secondary | ICD-10-CM

## 2015-09-29 DIAGNOSIS — Z113 Encounter for screening for infections with a predominantly sexual mode of transmission: Secondary | ICD-10-CM

## 2015-09-29 DIAGNOSIS — Z369 Encounter for antenatal screening, unspecified: Secondary | ICD-10-CM

## 2015-09-29 DIAGNOSIS — O0993 Supervision of high risk pregnancy, unspecified, third trimester: Secondary | ICD-10-CM

## 2015-09-29 DIAGNOSIS — Z36 Encounter for antenatal screening of mother: Secondary | ICD-10-CM

## 2015-09-29 DIAGNOSIS — Z1389 Encounter for screening for other disorder: Secondary | ICD-10-CM

## 2015-09-29 LAB — POCT URINALYSIS DIPSTICK
BILIRUBIN UA: NEGATIVE
Blood, UA: NEGATIVE
LEUKOCYTES UA: NEGATIVE
NITRITE UA: NEGATIVE
PH UA: 6
Spec Grav, UA: 1.015
Urobilinogen, UA: NEGATIVE

## 2015-09-29 NOTE — Progress Notes (Signed)
NONSTRESS TEST INTERPRETATION  INDICATIONS: Obesity and Gestational Diabetes  FHR baseline: 150 RESULTS: reactive COMMENTS:    PLAN: 1. Continue fetal kick counts twice a day. 2. Continue antepartum testing as scheduled-Biweekly   Fenton Mallingebbie Amarion Portell, LPN

## 2015-09-29 NOTE — Progress Notes (Signed)
ROB: Denies complaints.  Glucosuria (4+) noted today.  Patient reports taking Glyburide this morning after breakfast (however notes she did not comply with diet). Accucheck in office 156. Strongly encouraged better compliance.  NST performed today was reviewed and was found to be reactive.  Continue recommended antenatal testing and prenatal care. Scheduled for IOL on 8/18, however wondering if she can change date due to childcare issues.  Will contact L&D to see if change can be made. 36 week labs done today.  RTC in 1 week.

## 2015-10-01 LAB — GC/CHLAMYDIA PROBE AMP
CHLAMYDIA, DNA PROBE: NEGATIVE
Neisseria gonorrhoeae by PCR: NEGATIVE

## 2015-10-02 ENCOUNTER — Other Ambulatory Visit: Payer: Medicaid Other

## 2015-10-02 ENCOUNTER — Ambulatory Visit (INDEPENDENT_AMBULATORY_CARE_PROVIDER_SITE_OTHER): Payer: Medicaid Other | Admitting: Obstetrics and Gynecology

## 2015-10-02 VITALS — BP 122/55 | HR 116 | Wt 275.0 lb

## 2015-10-02 DIAGNOSIS — O24415 Gestational diabetes mellitus in pregnancy, controlled by oral hypoglycemic drugs: Secondary | ICD-10-CM

## 2015-10-02 DIAGNOSIS — O99210 Obesity complicating pregnancy, unspecified trimester: Secondary | ICD-10-CM

## 2015-10-02 LAB — CULTURE, BETA STREP (GROUP B ONLY): STREP GP B CULTURE: POSITIVE — AB

## 2015-10-05 NOTE — Progress Notes (Signed)
NONSTRESS TEST INTERPRETATION  INDICATIONS: GDM  FHR baseline: 140bpm RESULTS:Reactive COMMENTS: Follow up as scheduled for next visit and NST   PLAN: 1. Continue fetal kick counts twice a day. 2. Continue antepartum testing as scheduled-Biweekly 3. Pt informed of induction scheduled for 10/08/2015   Debbe Baleskinawa Michole Lecuyer, CMA

## 2015-10-06 ENCOUNTER — Ambulatory Visit (INDEPENDENT_AMBULATORY_CARE_PROVIDER_SITE_OTHER): Payer: Medicaid Other | Admitting: Obstetrics and Gynecology

## 2015-10-06 ENCOUNTER — Encounter: Payer: Medicaid Other | Admitting: Obstetrics and Gynecology

## 2015-10-06 ENCOUNTER — Other Ambulatory Visit: Payer: Medicaid Other

## 2015-10-06 ENCOUNTER — Encounter: Payer: Self-pay | Admitting: Obstetrics and Gynecology

## 2015-10-06 VITALS — BP 132/76 | HR 105 | Wt 281.4 lb

## 2015-10-06 DIAGNOSIS — Z1389 Encounter for screening for other disorder: Secondary | ICD-10-CM | POA: Diagnosis not present

## 2015-10-06 DIAGNOSIS — Z36 Encounter for antenatal screening of mother: Secondary | ICD-10-CM

## 2015-10-06 DIAGNOSIS — O24415 Gestational diabetes mellitus in pregnancy, controlled by oral hypoglycemic drugs: Secondary | ICD-10-CM

## 2015-10-06 DIAGNOSIS — Z8279 Family history of other congenital malformations, deformations and chromosomal abnormalities: Secondary | ICD-10-CM | POA: Diagnosis not present

## 2015-10-06 DIAGNOSIS — Z369 Encounter for antenatal screening, unspecified: Secondary | ICD-10-CM

## 2015-10-06 LAB — POCT URINALYSIS DIPSTICK
BILIRUBIN UA: NEGATIVE
Blood, UA: NEGATIVE
Glucose, UA: NEGATIVE
KETONES UA: NEGATIVE
Leukocytes, UA: NEGATIVE
Nitrite, UA: NEGATIVE
PH UA: 6
SPEC GRAV UA: 1.02
Urobilinogen, UA: NEGATIVE

## 2015-10-06 NOTE — Progress Notes (Signed)
NONSTRESS TEST INTERPRETATION  INDICATIONS: GDM  FHR baseline: 140 RESULTS: INCONCLUSIVE COMMENTS: Baby very active. Unable to get conclusive strip even holding monitor.    PLAN: 1. Continue fetal kick counts twice a day. 2. Continue antepartum testing as scheduled-Biweekly 3.  Fenton Mallingebbie Montreal Steidle, LPN

## 2015-10-09 ENCOUNTER — Other Ambulatory Visit: Payer: Medicaid Other

## 2015-10-10 ENCOUNTER — Inpatient Hospital Stay
Admission: RE | Admit: 2015-10-10 | Discharge: 2015-10-13 | DRG: 775 | Disposition: A | Discharge status: Home / Self Care | Payer: Medicaid Other | Attending: Obstetrics and Gynecology | Admitting: Obstetrics and Gynecology

## 2015-10-10 DIAGNOSIS — Z87891 Personal history of nicotine dependence: Secondary | ICD-10-CM | POA: Diagnosis not present

## 2015-10-10 DIAGNOSIS — Z9889 Other specified postprocedural states: Secondary | ICD-10-CM | POA: Diagnosis not present

## 2015-10-10 DIAGNOSIS — O9962 Diseases of the digestive system complicating childbirth: Secondary | ICD-10-CM | POA: Diagnosis present

## 2015-10-10 DIAGNOSIS — Z3A39 39 weeks gestation of pregnancy: Secondary | ICD-10-CM | POA: Diagnosis not present

## 2015-10-10 DIAGNOSIS — Z8249 Family history of ischemic heart disease and other diseases of the circulatory system: Secondary | ICD-10-CM

## 2015-10-10 DIAGNOSIS — O9902 Anemia complicating childbirth: Secondary | ICD-10-CM | POA: Diagnosis present

## 2015-10-10 DIAGNOSIS — Z8279 Family history of other congenital malformations, deformations and chromosomal abnormalities: Secondary | ICD-10-CM | POA: Diagnosis not present

## 2015-10-10 DIAGNOSIS — Z79899 Other long term (current) drug therapy: Secondary | ICD-10-CM

## 2015-10-10 DIAGNOSIS — Z833 Family history of diabetes mellitus: Secondary | ICD-10-CM

## 2015-10-10 DIAGNOSIS — O99824 Streptococcus B carrier state complicating childbirth: Secondary | ICD-10-CM | POA: Diagnosis present

## 2015-10-10 DIAGNOSIS — Z3483 Encounter for supervision of other normal pregnancy, third trimester: Secondary | ICD-10-CM | POA: Diagnosis not present

## 2015-10-10 DIAGNOSIS — O24425 Gestational diabetes mellitus in childbirth, controlled by oral hypoglycemic drugs: Secondary | ICD-10-CM | POA: Diagnosis present

## 2015-10-10 DIAGNOSIS — O24415 Gestational diabetes mellitus in pregnancy, controlled by oral hypoglycemic drugs: Secondary | ICD-10-CM | POA: Diagnosis present

## 2015-10-10 DIAGNOSIS — K219 Gastro-esophageal reflux disease without esophagitis: Secondary | ICD-10-CM | POA: Diagnosis present

## 2015-10-10 DIAGNOSIS — R17 Unspecified jaundice: Secondary | ICD-10-CM | POA: Diagnosis present

## 2015-10-10 DIAGNOSIS — Z6841 Body Mass Index (BMI) 40.0 and over, adult: Secondary | ICD-10-CM | POA: Diagnosis not present

## 2015-10-10 DIAGNOSIS — O99019 Anemia complicating pregnancy, unspecified trimester: Secondary | ICD-10-CM | POA: Diagnosis present

## 2015-10-10 HISTORY — DX: Gestational diabetes mellitus in pregnancy, unspecified control: O24.419

## 2015-10-10 LAB — CBC
HCT: 30.8 % — ABNORMAL LOW (ref 35.0–47.0)
Hemoglobin: 9.9 g/dL — ABNORMAL LOW (ref 12.0–16.0)
MCH: 27.6 pg (ref 26.0–34.0)
MCHC: 32.2 g/dL (ref 32.0–36.0)
MCV: 85.8 fL (ref 80.0–100.0)
Platelets: 259 10*3/uL (ref 150–440)
RBC: 3.59 MIL/uL — ABNORMAL LOW (ref 3.80–5.20)
RDW: 14.7 % — AB (ref 11.5–14.5)
WBC: 7.6 10*3/uL (ref 3.6–11.0)

## 2015-10-10 LAB — GLUCOSE, CAPILLARY: GLUCOSE-CAPILLARY: 181 mg/dL — AB (ref 65–99)

## 2015-10-10 MED ORDER — SODIUM CHLORIDE 0.9 % IV SOLN
2.0000 g | Freq: Once | INTRAVENOUS | Status: AC
  Administered 2015-10-10: 2 g via INTRAVENOUS
  Filled 2015-10-10: qty 2000

## 2015-10-10 MED ORDER — LIDOCAINE HCL (PF) 1 % IJ SOLN: 30.0000 mL | INTRAMUSCULAR | Status: DC | PRN

## 2015-10-10 MED ORDER — OXYCODONE-ACETAMINOPHEN 5-325 MG PO TABS: 2.0000 | ORAL_TABLET | ORAL | Status: DC | PRN

## 2015-10-10 MED ORDER — MISOPROSTOL 25 MCG QUARTER TABLET
25.0000 ug | ORAL_TABLET | ORAL | Status: DC | PRN
  Administered 2015-10-10 – 2015-10-11 (×2): 25 ug via VAGINAL
  Filled 2015-10-10: qty 0.25
  Filled 2015-10-10: qty 1
  Filled 2015-10-10: qty 0.25

## 2015-10-10 MED ORDER — BUTORPHANOL TARTRATE 1 MG/ML IJ SOLN
1.0000 mg | INTRAMUSCULAR | Status: DC | PRN
  Administered 2015-10-11 (×2): 1 mg via INTRAVENOUS
  Filled 2015-10-10 (×2): qty 1

## 2015-10-10 MED ORDER — OXYTOCIN BOLUS FROM INFUSION: 500.0000 mL | Freq: Once | INTRAVENOUS | Status: DC

## 2015-10-10 MED ORDER — LACTATED RINGERS IV SOLN
INTRAVENOUS | Status: DC
  Administered 2015-10-10 – 2015-10-11 (×2): via INTRAVENOUS

## 2015-10-10 MED ORDER — SODIUM CHLORIDE 0.9 % IV SOLN
1.0000 g | INTRAVENOUS | Status: DC
  Administered 2015-10-11 (×4): 1 g via INTRAVENOUS
  Filled 2015-10-10 (×7): qty 1000

## 2015-10-10 MED ORDER — ACETAMINOPHEN 325 MG PO TABS: 650.0000 mg | ORAL_TABLET | ORAL | Status: DC | PRN

## 2015-10-10 MED ORDER — OXYCODONE-ACETAMINOPHEN 5-325 MG PO TABS: 1.0000 | ORAL_TABLET | ORAL | Status: DC | PRN

## 2015-10-10 MED ORDER — LACTATED RINGERS IV SOLN: 500.0000 mL | INTRAVENOUS | Status: DC | PRN

## 2015-10-10 MED ORDER — SOD CITRATE-CITRIC ACID 500-334 MG/5ML PO SOLN
30.0000 mL | ORAL | Status: DC | PRN
  Administered 2015-10-10: 30 mL via ORAL
  Filled 2015-10-10: qty 15

## 2015-10-10 MED ORDER — TERBUTALINE SULFATE 1 MG/ML IJ SOLN
0.2500 mg | Freq: Once | INTRAMUSCULAR | Status: AC | PRN
  Administered 2015-10-11: 0.25 mg via SUBCUTANEOUS
  Filled 2015-10-10: qty 1

## 2015-10-10 MED ORDER — ONDANSETRON HCL 4 MG/2ML IJ SOLN
4.0000 mg | Freq: Four times a day (QID) | INTRAMUSCULAR | Status: DC | PRN
  Administered 2015-10-11: 4 mg via INTRAVENOUS
  Filled 2015-10-10: qty 2

## 2015-10-10 MED ORDER — OXYTOCIN 40 UNITS IN LACTATED RINGERS INFUSION - SIMPLE MED
2.5000 [IU]/h | INTRAVENOUS | Status: DC
  Administered 2015-10-11: 2.5 [IU]/h via INTRAVENOUS

## 2015-10-10 NOTE — Progress Notes (Signed)
ROB: Noting some contractions, irregular.  Discussed labor precautions.  Scheduled for IOL in 3 days for GDM.  NST performed today was inconclusive.  Excessive fetal movement so unable to trace well.  Would recommend sending to hospital to complete NST. Continue recommended antenatal testing and prenatal care.

## 2015-10-11 ENCOUNTER — Inpatient Hospital Stay: Payer: Medicaid Other | Admitting: Anesthesiology

## 2015-10-11 ENCOUNTER — Encounter: Payer: Self-pay | Admitting: *Deleted

## 2015-10-11 DIAGNOSIS — Z3483 Encounter for supervision of other normal pregnancy, third trimester: Secondary | ICD-10-CM

## 2015-10-11 LAB — HEPATIC FUNCTION PANEL
ALT: 10 U/L — AB (ref 14–54)
AST: 22 U/L (ref 15–41)
Albumin: 2.8 g/dL — ABNORMAL LOW (ref 3.5–5.0)
Alkaline Phosphatase: 127 U/L — ABNORMAL HIGH (ref 38–126)
BILIRUBIN DIRECT: 0.1 mg/dL (ref 0.1–0.5)
BILIRUBIN INDIRECT: 0.4 mg/dL (ref 0.3–0.9)
BILIRUBIN TOTAL: 0.5 mg/dL (ref 0.3–1.2)
Total Protein: 7.2 g/dL (ref 6.5–8.1)

## 2015-10-11 LAB — PROTEIN / CREATININE RATIO, URINE
Creatinine, Urine: 177 mg/dL
Protein Creatinine Ratio: 0.32 mg/mg{Cre} — ABNORMAL HIGH (ref 0.00–0.15)
Total Protein, Urine: 57 mg/dL

## 2015-10-11 LAB — GLUCOSE, CAPILLARY
Glucose-Capillary: 100 mg/dL — ABNORMAL HIGH (ref 65–99)
Glucose-Capillary: 96 mg/dL (ref 65–99)
Glucose-Capillary: 113 mg/dL — ABNORMAL HIGH (ref 65–99)
Glucose-Capillary: 156 mg/dL — ABNORMAL HIGH (ref 65–99)
Glucose-Capillary: 138 mg/dL — ABNORMAL HIGH (ref 65–99)

## 2015-10-11 MED ORDER — LIDOCAINE HCL (PF) 1 % IJ SOLN
INTRAMUSCULAR | Status: AC
  Filled 2015-10-11: qty 30

## 2015-10-11 MED ORDER — ONDANSETRON HCL 4 MG/2ML IJ SOLN: 4.0000 mg | Freq: Three times a day (TID) | INTRAMUSCULAR | Status: DC | PRN

## 2015-10-11 MED ORDER — MEPERIDINE HCL 25 MG/ML IJ SOLN: 6.2500 mg | INTRAMUSCULAR | Status: DC | PRN

## 2015-10-11 MED ORDER — SCOPOLAMINE 1 MG/3DAYS TD PT72: 1.0000 | MEDICATED_PATCH | Freq: Once | TRANSDERMAL | Status: DC

## 2015-10-11 MED ORDER — OXYTOCIN 10 UNIT/ML IJ SOLN
INTRAMUSCULAR | Status: AC
  Filled 2015-10-11: qty 2

## 2015-10-11 MED ORDER — MISOPROSTOL 200 MCG PO TABS
ORAL_TABLET | ORAL | Status: AC
  Filled 2015-10-11: qty 4

## 2015-10-11 MED ORDER — BUPIVACAINE HCL (PF) 0.25 % IJ SOLN
INTRAMUSCULAR | Status: DC | PRN
  Administered 2015-10-11: 5 mL via EPIDURAL

## 2015-10-11 MED ORDER — DIPHENHYDRAMINE HCL 50 MG/ML IJ SOLN: 12.5000 mg | INTRAMUSCULAR | Status: DC | PRN

## 2015-10-11 MED ORDER — FENTANYL 2.5 MCG/ML W/ROPIVACAINE 0.2% IN NS 100 ML EPIDURAL INFUSION (ARMC-ANES)
EPIDURAL | Status: AC
  Filled 2015-10-11: qty 100

## 2015-10-11 MED ORDER — DIPHENHYDRAMINE HCL 25 MG PO CAPS: 25.0000 mg | ORAL_CAPSULE | ORAL | Status: DC | PRN

## 2015-10-11 MED ORDER — FENTANYL 2.5 MCG/ML W/ROPIVACAINE 0.2% IN NS 100 ML EPIDURAL INFUSION (ARMC-ANES)
EPIDURAL | Status: DC | PRN
  Administered 2015-10-11: 10 mL/h via EPIDURAL

## 2015-10-11 MED ORDER — NALBUPHINE HCL 10 MG/ML IJ SOLN: 5.0000 mg | INTRAMUSCULAR | Status: DC | PRN

## 2015-10-11 MED ORDER — OXYTOCIN 40 UNITS IN LACTATED RINGERS INFUSION - SIMPLE MED
INTRAVENOUS | Status: AC
  Filled 2015-10-11: qty 1000

## 2015-10-11 MED ORDER — SODIUM CHLORIDE 0.9% FLUSH: 3.0000 mL | INTRAVENOUS | Status: DC | PRN

## 2015-10-11 MED ORDER — NALOXONE HCL 2 MG/2ML IJ SOSY
1.0000 ug/kg/h | PREFILLED_SYRINGE | INTRAVENOUS | Status: DC | PRN
  Filled 2015-10-11: qty 2

## 2015-10-11 MED ORDER — AMMONIA AROMATIC IN INHA
RESPIRATORY_TRACT | Status: AC
  Filled 2015-10-11: qty 10

## 2015-10-11 MED ORDER — FENTANYL 2.5 MCG/ML W/ROPIVACAINE 0.2% IN NS 100 ML EPIDURAL INFUSION (ARMC-ANES): 10.0000 mL/h | EPIDURAL | Status: DC

## 2015-10-11 MED ORDER — NALOXONE HCL 0.4 MG/ML IJ SOLN: 0.4000 mg | INTRAMUSCULAR | Status: DC | PRN

## 2015-10-11 MED ORDER — NALBUPHINE HCL 10 MG/ML IJ SOLN: 5.0000 mg | Freq: Once | INTRAMUSCULAR | Status: DC | PRN

## 2015-10-11 MED ORDER — TERBUTALINE SULFATE 1 MG/ML IJ SOLN
0.2500 mg | Freq: Once | INTRAMUSCULAR | Status: AC
  Administered 2015-10-11: 0.25 mg via SUBCUTANEOUS

## 2015-10-11 MED ORDER — KETOROLAC TROMETHAMINE 30 MG/ML IJ SOLN: 30.0000 mg | Freq: Four times a day (QID) | INTRAMUSCULAR | Status: DC | PRN

## 2015-10-11 MED ORDER — OXYTOCIN 40 UNITS IN LACTATED RINGERS INFUSION - SIMPLE MED
1.0000 m[IU]/min | INTRAVENOUS | Status: DC
  Administered 2015-10-11: 2 m[IU]/min via INTRAVENOUS
  Administered 2015-10-11: 1 m[IU]/min via INTRAVENOUS

## 2015-10-11 NOTE — Progress Notes (Signed)
Intrapartum Progress Note  S: Patient notes pain with contractions  O: Blood pressure (!) 152/87, pulse 84, temperature 97.5 F (36.4 C), temperature source Oral, resp. rate 16, height 5\' 2"  (1.575 m), weight 281 lb (127.5 kg), last menstrual period 01/08/2015. Gen App: NAD, mild distress with contractions Abdomen: soft, gravid FHT: baseline 120 bpm.  Accels present.  Decels absent. moderate in degree variability.   Tocometer: contractions q 3-4 minutes Cervix: 4/70/-3/cephalic/AROM'd Extremities: Nontender, no edema.  Pitocin: 1 mIU  Labs: No new labs    Assessment:  1: SIUP at 716w3d 2. GDM Class A2 3. Elevated BPs  Plan:  1.Continue IOL with Pitocin 2. IV pain meds for now, epidural desired once in active labor 3. Continue Accuchecks, will initiate insulin drip if indicated 4. Will order PIH labs for elevated BPs.    Hildred LaserAnika Khan Chura, MD 10/11/2015 2:49 PM

## 2015-10-11 NOTE — Anesthesia Procedure Notes (Signed)
Epidural Patient location during procedure: OB  Staffing Anesthesiologist: [name] AddisonHOMAS, [name] Luiz Performed: anesthesiologist   Preanesthetic Checklist Completed: patient identified, site marked, surgical consent, pre-op evaluation, timeout performed, IV checked, risks and benefits discussed and monitors and equipment checked  Epidural Patient position: sitting Prep: Betadine Patient monitoring: heart rate, continuous pulse ox and blood pressure Approach: midline Location: L4-L5 Injection technique: LOR saline  Needle:  Needle type: Tuohy  Needle gauge: 18 G Needle length: 9 cm and 9 Catheter type: closed end flexible Catheter size: 20 Guage Test dose: negative and 1.5% lidocaine with Epi 1:200 K  Assessment Sensory level: T10 Events: blood not aspirated, injection not painful, no injection resistance, negative IV test and no paresthesia  Additional Notes   Patient tolerated the insertion well without complications. 1745 catheter. 1747 test. 1749 bolus. 1800 infusion.Reason for block:procedure for pain

## 2015-10-11 NOTE — Progress Notes (Signed)
Intrapartum Progress Note   Called to assess patient as she is having deep variables with contractions (down to 80s from baseline 120s).    S: Patient notes moderate to severe pain with each contraction, desires epidural.   O: Blood pressure (!) 171/95, pulse (!) 112, temperature 97.5 F (36.4 C), temperature source Oral, resp. rate 16, height 5\' 2"  (1.575 m), weight 281 lb (127.5 kg), last menstrual period 01/08/2015. Gen App: NAD, comfortable Abdomen: soft, gravid FHT: baseline 90-100s bpm.  Accels present.  Decels present - deep variables with occasional late deceleration. . moderate in degree variability.   Tocometer: contractions q 1-2 minutes with intermittent couplets. Cervix: 6/90/c/-1, changed to 9/90/c/+0 after approximately 15 minutes Extremities: Nontender, no edema.  Pitocin: was at 2 mIU, discontinued with non-reassuring fetal tracing.   Labs:  Results for orders placed or performed during the hospital encounter of 10/10/15  CBC  Result Value Ref Range   WBC 7.6 3.6 - 11.0 K/uL   RBC 3.59 (L) 3.80 - 5.20 MIL/uL   Hemoglobin 9.9 (L) 12.0 - 16.0 g/dL   HCT 29.530.8 (L) 62.135.0 - 30.847.0 %   MCV 85.8 80.0 - 100.0 fL   MCH 27.6 26.0 - 34.0 pg   MCHC 32.2 32.0 - 36.0 g/dL   RDW 65.714.7 (H) 84.611.5 - 96.214.5 %   Platelets 259 150 - 440 K/uL  Glucose, capillary  Result Value Ref Range   Glucose-Capillary 181 (H) 65 - 99 mg/dL  Glucose, capillary  Result Value Ref Range   Glucose-Capillary 100 (H) 65 - 99 mg/dL  Glucose, capillary  Result Value Ref Range   Glucose-Capillary 96 65 - 99 mg/dL  Hepatic function panel  Result Value Ref Range   Total Protein 7.2 6.5 - 8.1 g/dL   Albumin 2.8 (L) 3.5 - 5.0 g/dL   AST 22 15 - 41 U/L   ALT 10 (L) 14 - 54 U/L   Alkaline Phosphatase 127 (H) 38 - 126 U/L   Total Bilirubin 0.5 0.3 - 1.2 mg/dL   Bilirubin, Direct 0.1 0.1 - 0.5 mg/dL   Indirect Bilirubin 0.4 0.3 - 0.9 mg/dL  Glucose, capillary  Result Value Ref Range   Glucose-Capillary 113  (H) 65 - 99 mg/dL      Assessment:  1: SIUP at 7647w3d 2. Non-reassuring fetal tracing 3. GBS+ 4. GDM  Plan:  1. IUPC and FSE placed. Rotated to left and right sides, facemask O2 placed. Given dose of terbutaline x 2 for uterine hyperstimulation 2. Patient with rapid cervical change.  Anticipate vaginal delivery soon.  Desires epidural. Anesthesia notified 3. GBS +, continue GBS prophylaxis with Ampicillin.  4. Continue Accuchecks for GDM.    Hildred LaserAnika Jonnette Nuon, MD 10/11/2015 5:25 PM

## 2015-10-11 NOTE — Anesthesia Preprocedure Evaluation (Signed)
Anesthesia Evaluation  Patient identified by MRN, date of birth, ID band Patient awake    Reviewed: Allergy & Precautions, NPO status , Patient's Chart, lab work & pertinent test results, reviewed documented beta blocker date and time   Airway Mallampati: III  TM Distance: >3 FB     Dental  (+) Chipped   Pulmonary former smoker,           Cardiovascular      Neuro/Psych    GI/Hepatic GERD  Controlled,  Endo/Other  diabetes, Type obesity  Renal/GU      Musculoskeletal   Abdominal   Peds  Hematology   Anesthesia Other Findings   Reproductive/Obstetrics                             Anesthesia Physical Anesthesia Plan  ASA: III  Anesthesia Plan: Spinal and Epidural   Post-op Pain Management:    Induction:   Airway Management Planned:   Additional Equipment:   Intra-op Plan:   Post-operative Plan:   Informed Consent: I have reviewed the patients History and Physical, chart, labs and discussed the procedure including the risks, benefits and alternatives for the proposed anesthesia with the patient or authorized representative who has indicated his/her understanding and acceptance.     Plan Discussed with: CRNA  Anesthesia Plan Comments:         Anesthesia Quick Evaluation

## 2015-10-11 NOTE — H&P (Signed)
Obstetric History and Physical  [name] Wolf is a 30 y.o. G2P1001 with IUP at 4189w3d presenting for scheduled IOL for GDM Class A2 (on Glyburide). Patient states she has been having  occasional contractions, none vaginal bleeding, intact membranes, with active fetal movement.    Prenatal Course Source of Care: [facility] with onset of care at 8 weeks Pregnancy complications or risks: Patient Active Problem List   Diagnosis Date Noted  . Family history of congenital anomalies   . Gestational diabetes mellitus (GDM) in third trimester controlled on oral hypoglycemic drug 06/03/2015  . Family or maternal historic risk of congenital anomaly 04/10/2015  . Tobacco abuse 04/10/2015  . Genetic screening 04/10/2015  . Pre-diabetes 04/10/2015  . Rubella non-immune status, antepartum 04/10/2015  . Acanthosis nigricans 05/08/2013  . Acid reflux 05/08/2013  . Snores 05/08/2013   She plans to breast and bottle feed. She desires Nexplanon for postpartum contraception.   Prenatal labs and studies: ABO, Rh: B/Positive/-- (01/13 1144) Antibody: Negative (01/13 1144) Rubella: <20.0 (01/13 1144) RPR: Non Reactive (01/13 1144)  HBsAg: Negative (01/13 1144)  HIV: Non Reactive (01/13 1144)  GBS: Positive (08/08 1622) 1 hr Glucola  Abnormal (220) Genetic screening normal Anatomy US normal    Obstetric History   G2   P1   T1   P0   A0   L1    SAB0   TAB0   Ectopic0   Multiple0   Live Births1     # Outcome Date GA Lbr Len/2nd Weight Sex Delivery Anes PTL Lv  2 Current           1 Term 08/04/06 3874w0d  6 lb 15 oz (3.147 kg) F Vag-Spont  N LIV    Obstetric Comments  G1 - fetus born with no fingers on left hand, had to create a rectum. No genetic disorders identified.       Past Medical History:  Diagnosis Date  . Gestational diabetes   . Hx of gallstones 4912ys old   LAP removal of stones  . Obesity, Class III, BMI 40-49.9 (morbid obesity) (HCC)   . Pre-diabetes     Past  Surgical History:  Procedure Laterality Date  . LAPAROSCOPY  30 yo   gallstones removed    Social History   Social History  . Marital status: Single    Spouse name: N/A  . Number of children: N/A  . Years of education: N/A   Occupational History  . unemployed    Social History Main Topics  . Smoking status: Former Smoker    Packs/day: 0.10  . Smokeless tobacco: Never Used  . Alcohol use No  . Drug use: No  . Sexual activity: Yes    Partners: Male   Other Topics Concern  . None   Social History Narrative  . None    Family History  Problem Relation Age of Onset  . Hypertension Mother   . Diabetes Sister   . Migraines Sister     Prescriptions Prior to Admission  Medication Sig Dispense Refill Last Dose  . ACCU-CHEK FASTCLIX LANCETS MISC Use as directed 4 times daily 1 each 6 10/11/2015 at Unknown time  . glucose blood (ACCU-CHEK AVIVA) test strip Use as instructed 100 each 12 10/11/2015 at Unknown time  . glyBURIDE (DIABETA) 5 MG tablet Take 2 tablets (10 mg total) by mouth 2 (two) times daily with a meal. 120 tablet 5 10/11/2015 at Unknown time  . Prenatal Vit-Fe Fumarate-FA (PNV PRENATAL PLUS  MULTIVITAMIN) 27-1 MG TABS Take 1 tablet by mouth daily.  11 10/11/2015 at Unknown time    No Known Allergies  Review of Systems: Negative except for what is mentioned in HPI.  Physical Exam: BP (!) 158/97   Pulse 84   Temp 97.8 F (36.6 C) (Oral)   Resp 18   Ht 5\' 2"  (1.575 m)   Wt 281 lb (127.5 kg)   LMP 01/08/2015 (Approximate)   BMI 51.40 kg/m  CONSTITUTIONAL: Well-developed, well-nourished female in no acute distress.  HENT:  Normocephalic, atraumatic, External right and left ear normal. Oropharynx is clear and moist EYES: Conjunctivae and EOM are normal. Pupils are equal, round, and reactive to light. No scleral icterus.  NECK: Normal range of motion, supple, no masses SKIN: Skin is warm and dry. No rash noted. Not diaphoretic. No erythema. No  pallor. NEUROLOGIC: Alert and oriented to person, place, and time. Normal reflexes, muscle tone coordination. No cranial nerve deficit noted. PSYCHIATRIC: Normal mood and affect. Normal behavior. Normal judgment and thought content. CARDIOVASCULAR: Normal heart rate noted, regular rhythm RESPIRATORY: Effort and breath sounds normal, no problems with respiration noted ABDOMEN: Soft, nontender, nondistended, gravid. MUSCULOSKELETAL: Normal range of motion. No edema and no tenderness. 2+ distal pulses.  Cervical Exam: Dilatation 3.5 cm   Effacement 50%   Station -3  (after AROM) Presentation: cephalic FHT:  Baseline rate 145 bpm   Variability moderate  Accelerations present   Decelerations variable Contractions: Every irregular, q 3-8 mins   Pertinent Labs/Studies:   Results for orders placed or performed during the hospital encounter of 10/10/15 (from the past 24 hour(s))  CBC     Status: Abnormal   Collection Time: 10/10/15 10:28 PM  Result Value Ref Range   WBC 7.6 3.6 - 11.0 K/uL   RBC 3.59 (L) 3.80 - 5.20 MIL/uL   Hemoglobin 9.9 (L) 12.0 - 16.0 g/dL   HCT 29.530.8 (L) 18.835.0 - 41.647.0 %   MCV 85.8 80.0 - 100.0 fL   MCH 27.6 26.0 - 34.0 pg   MCHC 32.2 32.0 - 36.0 g/dL   RDW 60.614.7 (H) 30.111.5 - 60.114.5 %   Platelets 259 150 - 440 K/uL  Glucose, capillary     Status: Abnormal   Collection Time: 10/10/15 10:38 PM  Result Value Ref Range   Glucose-Capillary 181 (H) 65 - 99 mg/dL  Glucose, capillary     Status: Abnormal   Collection Time: 10/11/15  5:04 AM  Result Value Ref Range   Glucose-Capillary 100 (H) 65 - 99 mg/dL    Assessment : [name] Wolf [name] Wolf is a 30 y.o. G2P1001 at 4456w3d being admitted for IOL for GDM Class A2.  Plan: Labor:  Induction with Cytotec, received 1 dose overnight.  AROM'd with clear fluid. Pitocin as needed, per protocol FWB: Reassuring fetal heart tracing.  GBS positive, will need Ampicillin for GBS prophylaxis.  Delivery plan: Hopeful for vaginal  delivery    Hildred LaserAnika David Rodriquez, MD [facility]

## 2015-10-12 DIAGNOSIS — O99019 Anemia complicating pregnancy, unspecified trimester: Secondary | ICD-10-CM | POA: Diagnosis present

## 2015-10-12 LAB — RPR: RPR: NONREACTIVE

## 2015-10-12 LAB — CBC
HCT: 27.3 % — ABNORMAL LOW (ref 35.0–47.0)
HEMOGLOBIN: 8.7 g/dL — AB (ref 12.0–16.0)
MCH: 27.2 pg (ref 26.0–34.0)
MCHC: 31.8 g/dL — ABNORMAL LOW (ref 32.0–36.0)
MCV: 85.5 fL (ref 80.0–100.0)
PLATELETS: 216 10*3/uL (ref 150–440)
RBC: 3.19 MIL/uL — AB (ref 3.80–5.20)
RDW: 15.1 % — ABNORMAL HIGH (ref 11.5–14.5)
WBC: 13.3 10*3/uL — AB (ref 3.6–11.0)

## 2015-10-12 LAB — GLUCOSE, CAPILLARY
GLUCOSE-CAPILLARY: 173 mg/dL — AB (ref 65–99)
GLUCOSE-CAPILLARY: 99 mg/dL (ref 65–99)
GLUCOSE-CAPILLARY: 73 mg/dL (ref 65–99)

## 2015-10-12 LAB — VARICELLA ZOSTER ANTIBODY, IGG: VARICELLA IGG: 1138 {index} (ref >165)

## 2015-10-12 LAB — RUBELLA SCREEN: Rubella: 1.08 index (ref >0.99)

## 2015-10-12 MED ORDER — OXYCODONE-ACETAMINOPHEN 5-325 MG PO TABS: 1.0000 | ORAL_TABLET | ORAL | Status: DC | PRN

## 2015-10-12 MED ORDER — SIMETHICONE 80 MG PO CHEW: 80.0000 mg | CHEWABLE_TABLET | ORAL | Status: DC | PRN

## 2015-10-12 MED ORDER — ZOLPIDEM TARTRATE 5 MG PO TABS: 5.0000 mg | ORAL_TABLET | Freq: Every evening | ORAL | Status: DC | PRN

## 2015-10-12 MED ORDER — IBUPROFEN 600 MG PO TABS
600.0000 mg | ORAL_TABLET | Freq: Four times a day (QID) | ORAL | Status: DC
  Administered 2015-10-12 (×2): 600 mg via ORAL
  Filled 2015-10-12 (×2): qty 1

## 2015-10-12 MED ORDER — DIPHENHYDRAMINE HCL 25 MG PO CAPS: 25.0000 mg | ORAL_CAPSULE | Freq: Four times a day (QID) | ORAL | Status: DC | PRN

## 2015-10-12 MED ORDER — ONDANSETRON HCL 4 MG PO TABS: 4.0000 mg | ORAL_TABLET | ORAL | Status: DC | PRN

## 2015-10-12 MED ORDER — PRENATAL MULTIVITAMIN CH
1.0000 | ORAL_TABLET | Freq: Every day | ORAL | Status: DC
  Administered 2015-10-12 – 2015-10-13 (×2): 1 via ORAL
  Filled 2015-10-12 (×2): qty 1

## 2015-10-12 MED ORDER — DIBUCAINE 1 % RE OINT: 1.0000 "application " | TOPICAL_OINTMENT | RECTAL | Status: DC | PRN

## 2015-10-12 MED ORDER — BENZOCAINE-MENTHOL 20-0.5 % EX AERO: 1.0000 "application " | INHALATION_SPRAY | CUTANEOUS | Status: DC | PRN

## 2015-10-12 MED ORDER — TETANUS-DIPHTH-ACELL PERTUSSIS 5-2.5-18.5 LF-MCG/0.5 IM SUSP
0.5000 mL | Freq: Once | INTRAMUSCULAR | Status: AC
  Administered 2015-10-13: 0.5 mL via INTRAMUSCULAR
  Filled 2015-10-12: qty 0.5

## 2015-10-12 MED ORDER — COCONUT OIL OIL: 1.0000 "application " | TOPICAL_OIL | Status: DC | PRN

## 2015-10-12 MED ORDER — ONDANSETRON HCL 4 MG/2ML IJ SOLN: 4.0000 mg | INTRAMUSCULAR | Status: DC | PRN

## 2015-10-12 MED ORDER — SENNOSIDES-DOCUSATE SODIUM 8.6-50 MG PO TABS
2.0000 | ORAL_TABLET | ORAL | Status: DC
  Administered 2015-10-12: 2 via ORAL
  Filled 2015-10-12: qty 2

## 2015-10-12 MED ORDER — WITCH HAZEL-GLYCERIN EX PADS: 1.0000 "application " | MEDICATED_PAD | CUTANEOUS | Status: DC | PRN

## 2015-10-12 MED ORDER — ACETAMINOPHEN 325 MG PO TABS: 650.0000 mg | ORAL_TABLET | ORAL | Status: DC | PRN

## 2015-10-12 MED ORDER — FERROUS SULFATE 325 (65 FE) MG PO TABS
325.0000 mg | ORAL_TABLET | Freq: Two times a day (BID) | ORAL | Status: DC
  Administered 2015-10-12 – 2015-10-13 (×4): 325 mg via ORAL
  Filled 2015-10-12 (×4): qty 1

## 2015-10-12 MED ORDER — IBUPROFEN 600 MG PO TABS
600.0000 mg | ORAL_TABLET | Freq: Four times a day (QID) | ORAL | Status: DC
  Administered 2015-10-12 – 2015-10-13 (×5): 600 mg via ORAL
  Filled 2015-10-12 (×5): qty 1

## 2015-10-12 MED ORDER — OXYCODONE-ACETAMINOPHEN 5-325 MG PO TABS
2.0000 | ORAL_TABLET | ORAL | Status: DC | PRN
  Administered 2015-10-12 (×3): 2 via ORAL
  Filled 2015-10-12 (×3): qty 2

## 2015-10-12 NOTE — Plan of Care (Signed)
Problem: Life Cycle: Goal: Risk for postpartum hemorrhage will decrease Outcome: Progressing Light Lochial Flow and fundus is Mid-line and U/E Goal: Chance of risk for complications during the postpartum period will decrease Outcome: Progressing Gestational Diabetic  Problem: Nutritional: Goal: Dietary intake will improve Outcome: Progressing Tolerating Reg. Diet

## 2015-10-12 NOTE — Anesthesia Postprocedure Evaluation (Signed)
Anesthesia Post Note  Patient: Darolyn RuaDarmesha Witherington  Procedure(s) Performed: * No procedures listed *  Patient location during evaluation: Mother Baby Anesthesia Type: Epidural Level of consciousness: awake and alert, oriented and patient cooperative Pain management: satisfactory to patient Vital Signs Assessment: post-procedure vital signs reviewed and stable Respiratory status: spontaneous breathing and respiratory function stable Cardiovascular status: stable Postop Assessment: no headache, no signs of nausea or vomiting, adequate PO intake and patient able to bend at knees Anesthetic complications: no    Last Vitals:  Vitals:   10/12/15 0131 10/12/15 0340  BP: 105/66 108/88  Pulse: 100 96  Resp: 20 19  Temp: 36.8 C 37 C    Last Pain:  Vitals:   10/12/15 0500  TempSrc:   PainSc: Larena GlassmanAsleep                 Maryfer Tauzin,  Cache Decoursey A

## 2015-10-12 NOTE — Progress Notes (Addendum)
Post Partum Day # 1, s/p SVD  Subjective: no complaints, up ad lib, voiding and tolerating PO  Objective: Temp:  [97.5 F (36.4 C)-98.6 F (37 C)] 98.5 F (36.9 C) (08/21 0739) Pulse Rate:  [84-154] 95 (08/21 0739) Resp:  [14-20] 20 (08/21 0739) BP: (98-171)/(52-120) 136/69 (08/21 0739) SpO2:  [98 %-100 %] 98 % (08/21 0340)  Physical Exam:  General: alert and no distress  Lungs: clear to auscultation bilaterally Breasts: normal appearance, no masses or tenderness Heart: regular rate and rhythm, S1, S2 normal, no murmur, click, rub or gallop Pelvis: Lochia: appropriate, Uterine Fundus: firm Extremities: DVT Evaluation: Negative Homan's sign. No cords or calf tenderness. No significant calf/ankle edema.   Recent Labs  10/10/15 2228 10/12/15 0636  HGB 9.9* 8.7*  HCT 30.8* 27.3*    Results for [name] Wolf, [name] Wolf (MRN [id number]) as of 10/12/2015 09:06  Ref. Range 10/12/2015 07:12  Glucose-Capillary Latest Ref Range: 65 - 99 mg/dL 295173 (H)   Assessment/Plan: Plan for discharge tomorrow, Circumcision prior to discharge and Contraception Nexplanon  Is bottle feeding. Glucose 173 this morning (however was not true fasting as patient notes that she ate a [place] sandwich at ~ 1:00 a.m.).  Will continue to check Accuchecks today.  Will also start on diabetic diet. Anemia of pregnancy, mild, asymptomatic.  Will need BID iron supplements.    LOS: 2 days   [name] Wolf [facility]

## 2015-10-13 LAB — GLUCOSE, CAPILLARY
GLUCOSE-CAPILLARY: 107 mg/dL — AB (ref 65–99)
GLUCOSE-CAPILLARY: 161 mg/dL — AB (ref 65–99)
Glucose-Capillary: 115 mg/dL — ABNORMAL HIGH (ref 65–99)

## 2015-10-13 MED ORDER — FERROUS SULFATE 325 (65 FE) MG PO TABS: 325.0000 mg | ORAL_TABLET | Freq: Two times a day (BID) | ORAL | 1 refills | Status: DC

## 2015-10-13 MED ORDER — IBUPROFEN 800 MG PO TABS: 800.0000 mg | ORAL_TABLET | Freq: Three times a day (TID) | ORAL | 1 refills | Status: DC | PRN

## 2015-10-13 MED ORDER — DOCUSATE SODIUM 100 MG PO CAPS: 100.0000 mg | ORAL_CAPSULE | Freq: Two times a day (BID) | ORAL | 2 refills | Status: DC | PRN

## 2015-10-13 NOTE — Progress Notes (Addendum)
Post Partum Day # 2, s/p SVD  Subjective: no complaints, up ad lib, voiding and tolerating PO  Objective: Vitals:   10/12/15 1200 10/12/15 1547 10/12/15 2100 10/13/15 0734  BP: 140/90 135/85 136/84 (!) 107/50  Pulse: (!) 101 88 84 86  Resp: 18 20 18 18   Temp: 98.5 F (36.9 C) 98.7 F (37.1 C) 98.3 F (36.8 C) 98.2 F (36.8 C)  TempSrc: Oral  Oral   SpO2: 98%  100%   Weight:      Height:        Physical Exam:  General: alert and no distress  Lungs: clear to auscultation bilaterally Breasts: normal appearance, no masses or tenderness Heart: regular rate and rhythm, S1, S2 normal, no murmur, click, rub or gallop Pelvis: Lochia: appropriate, Uterine Fundus: firm Extremities: DVT Evaluation: Negative Homan's sign. No cords or calf tenderness. No significant calf/ankle edema.   Recent Labs  10/10/15 2228 10/12/15 0636  HGB 9.9* 8.7*  HCT 30.8* 27.3*    Results for [name], Gracelyn (MRN [id number]) as of 10/13/2015 08:03  Ref. Range 10/12/2015 07:12 10/12/2015 13:37 10/12/2015 20:26 10/13/2015 00:25  Glucose-Capillary Latest Ref Range: 65 - 99 mg/dL 409173 (H) 99 73 811161 (H)   Assessment/Plan: Discharge home, Circumcision after discharge, and Contraception Nexplanon  Is breast and bottle feeding. Patient still with occasionally elevated blood sugars.  Will need 2 hr glucola at 6 week postpartum visit. Anemia of pregnancy, mild, asymptomatic.  Will need BID iron supplements.    LOS: 3 days   Hildred LaserAnika Jaziah Kwasnik [facility]

## 2015-10-13 NOTE — Discharge Summary (Signed)
Obstetric Discharge Summary Reason for Admission: induction of labor for GDM on Glyburide Prenatal Procedures: NST and ultrasound Intrapartum Procedures: spontaneous vaginal delivery Postpartum Procedures: none Complications-Operative and Postpartum: none Hemoglobin  Date Value Ref Range Status  10/12/2015 8.7 (L) 12.0 - 16.0 g/dL Final   HCT  Date Value Ref Range Status  10/12/2015 27.3 (L) 35.0 - 47.0 % Final   Hematocrit  Date Value Ref Range Status  03/06/2015 36.1 34.0 - 46.6 % Final    Physical Exam:  General: alert and no distress Lochia: appropriate Uterine Fundus: firm Incision: None DVT Evaluation: Negative Homan's sign. No cords or calf tenderness. No significant calf/ankle edema.  Discharge Diagnoses: Term Pregnancy-delivered and Gestational Diabetes, Anemia of pregnancy  Discharge Information: Date: 10/13/2015 Activity: pelvic rest Diet: routine Medications: PNV, Ibuprofen, Colace and Iron Condition: stable Instructions: refer to practice specific booklet Discharge to: home Follow-up Information    Hildred LaserAnika Sharmila Wrobleski, MD. Schedule an appointment as soon as possible for a visit in 6 week(s).   Specialties:  Obstetrics and Gynecology, Radiology Why:  For postpartum visit and 2 hour glucola.  Contact information: [address] [place] [id number] [phone]           Newborn Data: Live born female  Birth Weight: 8 lb 6 oz (3800 g) APGAR: 8, 9   Infant to remain inpatient for phototherapy (being treated for jaundice).    Hildred Lasernika Roselia Snipe 10/13/2015, 8:06 AM

## 2015-10-13 NOTE — Discharge Instructions (Signed)

## 2015-11-19 ENCOUNTER — Ambulatory Visit: Payer: Medicaid Other | Admitting: Obstetrics and Gynecology

## 2016-05-16 NOTE — Progress Notes (Signed)
Extra encounter; seen by Dr. Valentino Saxonherry and documentation in that encounter.

## 2016-05-16 NOTE — Progress Notes (Signed)
Documentation was provided on encounter on Dr. Oretha Milchherry's schedule.

## 2016-12-09 ENCOUNTER — Emergency Department
Admission: EM | Admit: 2016-12-09 | Discharge: 2016-12-09 | Disposition: A | Discharge status: Home / Self Care | Payer: No Typology Code available for payment source | Attending: Student in an Organized Health Care Education/Training Program | Admitting: Student in an Organized Health Care Education/Training Program

## 2016-12-09 ENCOUNTER — Encounter: Payer: Self-pay | Admitting: Emergency Medicine

## 2016-12-09 DIAGNOSIS — Z87891 Personal history of nicotine dependence: Secondary | ICD-10-CM | POA: Insufficient documentation

## 2016-12-09 DIAGNOSIS — M7918 Myalgia, other site: Secondary | ICD-10-CM

## 2016-12-09 DIAGNOSIS — Z79899 Other long term (current) drug therapy: Secondary | ICD-10-CM | POA: Insufficient documentation

## 2016-12-09 MED ORDER — CYCLOBENZAPRINE HCL 5 MG PO TABS: 5.0000 mg | ORAL_TABLET | Freq: Three times a day (TID) | ORAL | 0 refills | Status: DC | PRN
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
## 2017-03-14 ENCOUNTER — Emergency Department
Admission: EM | Admit: 2017-03-14 | Discharge: 2017-03-14 | Disposition: A | Discharge status: Home / Self Care | Payer: Self-pay | Attending: Emergency Medicine | Admitting: Emergency Medicine

## 2017-03-14 ENCOUNTER — Encounter: Payer: Self-pay | Admitting: Emergency Medicine

## 2017-03-14 DIAGNOSIS — N390 Urinary tract infection, site not specified: Secondary | ICD-10-CM | POA: Insufficient documentation

## 2017-03-14 DIAGNOSIS — F17210 Nicotine dependence, cigarettes, uncomplicated: Secondary | ICD-10-CM | POA: Insufficient documentation

## 2017-03-14 DIAGNOSIS — M545 Low back pain, unspecified: Secondary | ICD-10-CM

## 2017-03-14 DIAGNOSIS — Z79899 Other long term (current) drug therapy: Secondary | ICD-10-CM | POA: Insufficient documentation

## 2017-03-14 LAB — URINALYSIS, COMPLETE (UACMP) WITH MICROSCOPIC
Bilirubin Urine: NEGATIVE
Glucose, UA: NEGATIVE mg/dL
HGB URINE DIPSTICK: NEGATIVE
KETONES UR: NEGATIVE mg/dL
Leukocytes, UA: NEGATIVE
NITRITE: NEGATIVE
PROTEIN: NEGATIVE mg/dL
Specific Gravity, Urine: 1.025 (ref 1.005–1.030)
pH: 5 (ref 5.0–8.0)

## 2017-03-14 MED ORDER — IBUPROFEN 800 MG PO TABS: 800.0000 mg | ORAL_TABLET | Freq: Three times a day (TID) | ORAL | 0 refills | Status: DC | PRN

## 2017-03-14 MED ORDER — CEPHALEXIN 500 MG PO CAPS: 500.0000 mg | ORAL_CAPSULE | Freq: Four times a day (QID) | ORAL | 0 refills | Status: AC

## 2017-03-14 NOTE — ED Provider Notes (Signed)
[facility] Emergency Department Provider Note  ____________________________________________  Time seen: Approximately 4:21 PM  I have reviewed the triage vital signs and the nursing notes.   HISTORY  Chief Complaint Back Pain    HPI [name] Wolf is a 32 y.o. female that presents to the emergency department for evaluation of low left back pain for 3 days.  Pain does not radiate.  No injury.  This is never happened before.  Last menstrual period was 2 weeks ago.  No alleviating measures have been attempted.  No bowel or bladder dysfunction or saddle paresthesias.  She denies nausea, vomiting, abdominal pain, dysuria, urgency, frequency, numbness, tingling.   Past Medical History:  Diagnosis Date  . Gestational diabetes   . Hx of gallstones [age]ys old   LAP removal of stones  . Obesity, Class III, BMI 40-49.9 (morbid obesity) (HCC)   . Pre-diabetes     Patient Active Problem List   Diagnosis Date Noted  . Anemia of pregnancy 10/12/2015  . Family history of congenital anomalies   . Gestational diabetes mellitus (GDM) in third trimester controlled on oral hypoglycemic drug 06/03/2015  . Family or maternal historic risk of congenital anomaly 04/10/2015  . Tobacco abuse 04/10/2015  . Genetic screening 04/10/2015  . Pre-diabetes 04/10/2015  . Rubella non-immune status, antepartum 04/10/2015  . Acanthosis nigricans 05/08/2013  . Acid reflux 05/08/2013  . Snores 05/08/2013    Past Surgical History:  Procedure Laterality Date  . LAPAROSCOPY  32 yo   gallstones removed    Prior to Admission medications   Medication Sig Start Date End Date Taking? Authorizing Provider  cephALEXin (KEFLEX) 500 MG capsule Take 1 capsule (500 mg total) by mouth 4 (four) times daily for 10 days. 03/14/17 03/24/17  Enid DerryWagner, Hulan Szumski, PA-C  cyclobenzaprine (FLEXERIL) 5 MG tablet Take 1 tablet (5 mg total) by mouth 3 (three) times daily as needed for muscle spasms. 12/09/16    Menshew, Charlesetta IvoryJenise V Bacon, PA-C  docusate sodium (COLACE) 100 MG capsule Take 1 capsule (100 mg total) by mouth 2 (two) times daily as needed. 10/13/15   Hildred Laserherry, Anika, MD  ferrous sulfate (FERROUSUL) 325 (65 FE) MG tablet Take 1 tablet (325 mg total) by mouth 2 (two) times daily. 10/13/15   Hildred Laserherry, Anika, MD  ibuprofen (ADVIL,MOTRIN) 800 MG tablet Take 1 tablet (800 mg total) by mouth every 8 (eight) hours as needed. 03/14/17   Enid DerryWagner, Jovonda Selner, PA-C  Prenatal Vit-Fe Fumarate-FA (PNV PRENATAL PLUS MULTIVITAMIN) 27-1 MG TABS Take 1 tablet by mouth daily. 02/12/15   [provider]    Allergies Patient has no known allergies.  Family History  Problem Relation Age of Onset  . Hypertension Mother   . Diabetes Sister   . Migraines Sister     Social History Social History   Tobacco Use  . Smoking status: Current Every Day Smoker    Packs/day: 1.00    Types: Cigarettes  . Smokeless tobacco: Never Used  Substance Use Topics  . Alcohol use: No    Alcohol/week: 0.0 oz  . Drug use: No     Review of Systems  Constitutional: No fever/chills Cardiovascular: No chest pain. Respiratory:  No SOB. Gastrointestinal: No abdominal pain.  No nausea, no vomiting.  Genitourinary: Negative for dysuria. Musculoskeletal: Positive for back pain. Skin: Negative for rash, abrasions, lacerations, ecchymosis. Neurological: Negative for numbness or tingling   ____________________________________________   PHYSICAL EXAM:  VITAL SIGNS: ED Triage Vitals  Enc Vitals Group  BP 03/14/17 1526 130/81     Pulse Rate 03/14/17 1526 96     Resp 03/14/17 1526 16     Temp 03/14/17 1526 97.8 F (36.6 C)     Temp Source 03/14/17 1526 Oral     SpO2 03/14/17 1526 99 %     Weight 03/14/17 1527 265 lb (120.2 kg)     Height 03/14/17 1527 5\' 2"  (1.575 m)     Head Circumference --      Peak Flow --      Pain Score 03/14/17 1526 9     Pain Loc --      Pain Edu? --      Excl. in GC? --       Constitutional: Alert and oriented. Well appearing and in no acute distress. Eyes: Conjunctivae are normal. PERRL. EOMI. Head: Atraumatic. ENT:      Ears:      Nose: No congestion/rhinnorhea.      Mouth/Throat: Mucous membranes are moist.  Neck: No stridor. Cardiovascular: Normal rate, regular rhythm.  Good peripheral circulation. Respiratory: Normal respiratory effort without tachypnea or retractions. Lungs CTAB. Good air entry to the bases with no decreased or absent breath sounds. Gastrointestinal: Bowel sounds 4 quadrants. Soft and nontender to palpation. No guarding or rigidity. No palpable masses. No distention. No CVA tenderness. Musculoskeletal: Full range of motion to all extremities. No gross deformities appreciated.  No tenderness to palpation of the lumbar spine.  Tenderness to palpation over left lumbar muscles.  Strength 5 out of 5 in lower extremities bilaterally.  Normal gait. Neurologic:  Normal speech and language. No gross focal neurologic deficits are appreciated.  Skin:  Skin is warm, dry and intact. No rash noted.   ____________________________________________   LABS (all labs ordered are listed, but only abnormal results are displayed)  Labs Reviewed  URINALYSIS, COMPLETE (UACMP) WITH MICROSCOPIC - Abnormal; Notable for the following components:      Result Value   Color, Urine YELLOW (*)    APPearance CLOUDY (*)    Bacteria, UA MANY (*)    Squamous Epithelial / LPF 0-5 (*)    All other components within normal limits   ____________________________________________  EKG   ____________________________________________  RADIOLOGY   No results found.  ____________________________________________    PROCEDURES  Procedure(s) performed:    Procedures    Medications - No data to display   ____________________________________________   INITIAL IMPRESSION / ASSESSMENT AND PLAN / ED COURSE  Pertinent labs & imaging results that were  available during my care of the patient were reviewed by me and considered in my medical decision making (see chart for details).  Review of the [place] CSRS was performed in accordance of the [facility] prior to dispensing any controlled drugs.   Patient's diagnosis is consistent with urinary tract infection.  Vital signs and exam are reassuring.  Urinalysis consistent with infection.  Patient will be discharged home with prescriptions for Keflex and ibuprofen. Patient is to follow up with PCP as directed. Patient is given ED precautions to return to the ED for any worsening or new symptoms.     ____________________________________________  FINAL CLINICAL IMPRESSION(S) / ED DIAGNOSES  Final diagnoses:  Lower urinary tract infectious disease  Acute left-sided low back pain without sciatica      NEW MEDICATIONS STARTED DURING THIS VISIT:  ED Discharge Orders        Ordered    cephALEXin (KEFLEX) 500 MG capsule  4 times daily  03/14/17 1622    ibuprofen (ADVIL,MOTRIN) 800 MG tablet  Every 8 hours PRN     03/14/17 1622          This chart was dictated using voice recognition software/Dragon. Despite best efforts to proofread, errors can occur which can change the meaning. Any change was purely unintentional.    Enid Derry, PA-C 03/14/17 1659    Don Perking, [place], MD 03/15/17 [phone]

## 2017-03-14 NOTE — ED Notes (Signed)
See triage note presents with a 3 day hx of left lower back pain  Denies any injury or urinary sxs'  States pain is non radiating  ambulates well to treat roo m

## 2017-03-14 NOTE — ED Triage Notes (Signed)
Pt in via POV with complaints of worsening lower back pain x 3 days, denies any recent injury, denies any urinary symptoms.  Pt ambulatory to triage, some discomfort noted.

## 2017-05-14 ENCOUNTER — Emergency Department: Payer: Self-pay

## 2017-05-14 ENCOUNTER — Encounter: Payer: Self-pay | Admitting: Emergency Medicine

## 2017-05-14 ENCOUNTER — Other Ambulatory Visit: Payer: Self-pay

## 2017-05-14 ENCOUNTER — Inpatient Hospital Stay
Admission: EM | Admit: 2017-05-14 | Discharge: 2017-05-16 | DRG: 872 | Disposition: A | Discharge status: Home / Self Care | Payer: Self-pay | Attending: Specialist | Admitting: Specialist

## 2017-05-14 DIAGNOSIS — E119 Type 2 diabetes mellitus without complications: Secondary | ICD-10-CM

## 2017-05-14 DIAGNOSIS — F17210 Nicotine dependence, cigarettes, uncomplicated: Secondary | ICD-10-CM | POA: Diagnosis present

## 2017-05-14 DIAGNOSIS — B9620 Unspecified Escherichia coli [E. coli] as the cause of diseases classified elsewhere: Secondary | ICD-10-CM | POA: Diagnosis present

## 2017-05-14 DIAGNOSIS — Z6841 Body Mass Index (BMI) 40.0 and over, adult: Secondary | ICD-10-CM

## 2017-05-14 DIAGNOSIS — Z8249 Family history of ischemic heart disease and other diseases of the circulatory system: Secondary | ICD-10-CM

## 2017-05-14 DIAGNOSIS — E871 Hypo-osmolality and hyponatremia: Secondary | ICD-10-CM | POA: Diagnosis present

## 2017-05-14 DIAGNOSIS — R739 Hyperglycemia, unspecified: Secondary | ICD-10-CM

## 2017-05-14 DIAGNOSIS — A084 Viral intestinal infection, unspecified: Secondary | ICD-10-CM | POA: Diagnosis present

## 2017-05-14 DIAGNOSIS — Z833 Family history of diabetes mellitus: Secondary | ICD-10-CM

## 2017-05-14 DIAGNOSIS — E1165 Type 2 diabetes mellitus with hyperglycemia: Secondary | ICD-10-CM | POA: Diagnosis present

## 2017-05-14 DIAGNOSIS — E669 Obesity, unspecified: Secondary | ICD-10-CM | POA: Diagnosis present

## 2017-05-14 DIAGNOSIS — N390 Urinary tract infection, site not specified: Secondary | ICD-10-CM

## 2017-05-14 DIAGNOSIS — N12 Tubulo-interstitial nephritis, not specified as acute or chronic: Secondary | ICD-10-CM | POA: Diagnosis present

## 2017-05-14 DIAGNOSIS — A419 Sepsis, unspecified organism: Principal | ICD-10-CM | POA: Diagnosis present

## 2017-05-14 DIAGNOSIS — R000 Tachycardia, unspecified: Secondary | ICD-10-CM

## 2017-05-14 DIAGNOSIS — E860 Dehydration: Secondary | ICD-10-CM | POA: Diagnosis present

## 2017-05-14 LAB — BETA-HYDROXYBUTYRIC ACID: Beta-Hydroxybutyric Acid: 0.72 mmol/L — ABNORMAL HIGH (ref 0.05–0.27)

## 2017-05-14 LAB — URINALYSIS, COMPLETE (UACMP) WITH MICROSCOPIC
BILIRUBIN URINE: NEGATIVE
Glucose, UA: >=500 mg/dL — AB
KETONES UR: 20 mg/dL — AB
Nitrite: POSITIVE — AB
Protein, ur: 100 mg/dL — AB
Specific Gravity, Urine: 1.026 (ref 1.005–1.030)
pH: 5 (ref 5.0–8.0)

## 2017-05-14 LAB — BASIC METABOLIC PANEL
ANION GAP: 13 (ref 5–15)
BUN: 13 mg/dL (ref 6–20)
CHLORIDE: 96 mmol/L — AB (ref 101–111)
CO2: 19 mmol/L — AB (ref 22–32)
Calcium: 9.1 mg/dL (ref 8.9–10.3)
Creatinine, Ser: 1.24 mg/dL — ABNORMAL HIGH (ref 0.44–1.00)
GFR calc non Af Amer: 58 mL/min — ABNORMAL LOW (ref >60)
Glucose, Bld: 342 mg/dL — ABNORMAL HIGH (ref 65–99)
Potassium: 4 mmol/L (ref 3.5–5.1)
Sodium: 128 mmol/L — ABNORMAL LOW (ref 135–145)

## 2017-05-14 LAB — GLUCOSE, CAPILLARY
Glucose-Capillary: 275 mg/dL — ABNORMAL HIGH (ref 65–99)
Glucose-Capillary: 183 mg/dL — ABNORMAL HIGH (ref 65–99)

## 2017-05-14 LAB — HEPATIC FUNCTION PANEL
ALT: 16 U/L (ref 14–54)
AST: 28 U/L (ref 15–41)
Albumin: 3.7 g/dL (ref 3.5–5.0)
Alkaline Phosphatase: 101 U/L (ref 38–126)
BILIRUBIN TOTAL: 1.1 mg/dL (ref 0.3–1.2)
Bilirubin, Direct: 0.3 mg/dL (ref 0.1–0.5)
Indirect Bilirubin: 0.8 mg/dL (ref 0.3–0.9)
Total Protein: 8.9 g/dL — ABNORMAL HIGH (ref 6.5–8.1)

## 2017-05-14 LAB — CBC
HEMATOCRIT: 42.9 % (ref 35.0–47.0)
HEMOGLOBIN: 13.4 g/dL (ref 12.0–16.0)
MCH: 25.5 pg — ABNORMAL LOW (ref 26.0–34.0)
MCHC: 31.3 g/dL — ABNORMAL LOW (ref 32.0–36.0)
MCV: 81.2 fL (ref 80.0–100.0)
Platelets: 308 10*3/uL (ref 150–440)
RBC: 5.28 MIL/uL — AB (ref 3.80–5.20)
RDW: 15.5 % — ABNORMAL HIGH (ref 11.5–14.5)
WBC: 22.9 10*3/uL — ABNORMAL HIGH (ref 3.6–11.0)

## 2017-05-14 LAB — TROPONIN I
Troponin I: <0.03 ng/mL (ref <0.03)
Troponin I: <0.03 ng/mL (ref <0.03)

## 2017-05-14 LAB — TSH: TSH: 0.781 u[IU]/mL (ref 0.350–4.500)

## 2017-05-14 LAB — INFLUENZA PANEL BY PCR (TYPE A & B)
INFLBPCR: NEGATIVE
Influenza A By PCR: NEGATIVE

## 2017-05-14 LAB — LACTIC ACID, PLASMA: Lactic Acid, Venous: 1.6 mmol/L (ref 0.5–1.9)

## 2017-05-14 LAB — MAGNESIUM: Magnesium: 1.3 mg/dL — ABNORMAL LOW (ref 1.7–2.4)

## 2017-05-14 LAB — LIPASE, BLOOD: Lipase: 23 U/L (ref 11–51)

## 2017-05-14 LAB — POCT PREGNANCY, URINE: PREG TEST UR: NEGATIVE

## 2017-05-14 MED ORDER — SODIUM CHLORIDE 0.9 % IV SOLN
INTRAVENOUS | Status: DC
  Administered 2017-05-14 – 2017-05-16 (×5): via INTRAVENOUS

## 2017-05-14 MED ORDER — CEFTRIAXONE SODIUM 1 G IJ SOLR
1.0000 g | Freq: Once | INTRAMUSCULAR | Status: AC
  Administered 2017-05-14: 1 g via INTRAVENOUS
  Filled 2017-05-14: qty 10

## 2017-05-14 MED ORDER — SODIUM CHLORIDE 0.9 % IV BOLUS (SEPSIS)
1000.0000 mL | Freq: Once | INTRAVENOUS | Status: AC
  Administered 2017-05-14: 1000 mL via INTRAVENOUS

## 2017-05-14 MED ORDER — ONDANSETRON HCL 4 MG/2ML IJ SOLN
4.0000 mg | Freq: Four times a day (QID) | INTRAMUSCULAR | Status: DC | PRN
  Administered 2017-05-15: 4 mg via INTRAVENOUS
  Filled 2017-05-14: qty 2

## 2017-05-14 MED ORDER — SODIUM CHLORIDE 0.9 % IV SOLN: 1.0000 g | INTRAVENOUS | Status: DC

## 2017-05-14 MED ORDER — ONDANSETRON HCL 4 MG PO TABS: 4.0000 mg | ORAL_TABLET | Freq: Four times a day (QID) | ORAL | Status: DC | PRN

## 2017-05-14 MED ORDER — ONDANSETRON HCL 4 MG/2ML IJ SOLN
INTRAMUSCULAR | Status: AC
  Administered 2017-05-14: 4 mg via INTRAVENOUS
  Filled 2017-05-14: qty 2

## 2017-05-14 MED ORDER — MAGNESIUM SULFATE 2 GM/50ML IV SOLN
2.0000 g | Freq: Once | INTRAVENOUS | Status: AC
  Administered 2017-05-14: 2 g via INTRAVENOUS
  Filled 2017-05-14: qty 50

## 2017-05-14 MED ORDER — ACETAMINOPHEN 650 MG RE SUPP
650.0000 mg | Freq: Four times a day (QID) | RECTAL | Status: DC | PRN
  Administered 2017-05-15: 650 mg via RECTAL
  Filled 2017-05-14: qty 1

## 2017-05-14 MED ORDER — INSULIN ASPART 100 UNIT/ML ~~LOC~~ SOLN
8.0000 [IU] | Freq: Once | SUBCUTANEOUS | Status: AC
  Administered 2017-05-14: 8 [IU] via INTRAVENOUS
  Filled 2017-05-14: qty 1

## 2017-05-14 MED ORDER — ONDANSETRON HCL 4 MG/2ML IJ SOLN
4.0000 mg | Freq: Once | INTRAMUSCULAR | Status: AC
  Administered 2017-05-14: 4 mg via INTRAVENOUS

## 2017-05-14 MED ORDER — INSULIN ASPART 100 UNIT/ML ~~LOC~~ SOLN
0.0000 [IU] | Freq: Three times a day (TID) | SUBCUTANEOUS | Status: DC
  Administered 2017-05-15: 15 [IU] via SUBCUTANEOUS
  Administered 2017-05-15 (×2): 7 [IU] via SUBCUTANEOUS
  Administered 2017-05-16: 4 [IU] via SUBCUTANEOUS
  Administered 2017-05-16: 2 [IU] via SUBCUTANEOUS
  Filled 2017-05-14 (×5): qty 1

## 2017-05-14 MED ORDER — ACETAMINOPHEN 325 MG PO TABS
650.0000 mg | ORAL_TABLET | Freq: Four times a day (QID) | ORAL | Status: DC | PRN
  Administered 2017-05-15: 650 mg via ORAL
  Filled 2017-05-14: qty 2

## 2017-05-14 MED ORDER — ACETAMINOPHEN 500 MG PO TABS
1000.0000 mg | ORAL_TABLET | Freq: Once | ORAL | Status: AC
  Administered 2017-05-14: 1000 mg via ORAL
  Filled 2017-05-14: qty 2

## 2017-05-14 MED ORDER — NICOTINE 21 MG/24HR TD PT24
21.0000 mg | MEDICATED_PATCH | Freq: Every day | TRANSDERMAL | Status: DC
  Administered 2017-05-15: 21 mg via TRANSDERMAL
  Filled 2017-05-14 (×3): qty 1

## 2017-05-14 MED ORDER — HYDROCODONE-ACETAMINOPHEN 5-325 MG PO TABS
1.0000 | ORAL_TABLET | ORAL | Status: DC | PRN
  Administered 2017-05-15 (×2): 1 via ORAL
  Administered 2017-05-16 (×2): 2 via ORAL
  Filled 2017-05-14: qty 2
  Filled 2017-05-14: qty 1
  Filled 2017-05-14 (×2): qty 2

## 2017-05-14 MED ORDER — INSULIN ASPART 100 UNIT/ML ~~LOC~~ SOLN: 0.0000 [IU] | Freq: Every day | SUBCUTANEOUS | Status: DC

## 2017-05-14 MED ORDER — ENOXAPARIN SODIUM 40 MG/0.4ML ~~LOC~~ SOLN
40.0000 mg | Freq: Two times a day (BID) | SUBCUTANEOUS | Status: DC
  Administered 2017-05-14 – 2017-05-16 (×4): 40 mg via SUBCUTANEOUS
  Filled 2017-05-14 (×5): qty 0.4

## 2017-05-14 NOTE — H&P (Addendum)
[facility] [place] [facility]   PATIENT NAME: [name] Wolf    MR#:  [id number]  DATE OF BIRTH:  1985/07/27  DATE OF ADMISSION:  05/14/2017  PRIMARY CARE PHYSICIAN: System, Pcp Not In   REQUESTING/REFERRING PHYSICIAN:   CHIEF COMPLAINT:   Nausea, vomiting, fever  HISTORY OF PRESENT ILLNESS: [name] RuaDarmesha Habeeb  is a 32 y.o. female with a known history of gestational diabetes mellitus, obesity, cholelithiasis presented to the emergency room with cough congestion and nausea and vomiting.  Patient had nausea and vomiting yesterday.  She had diarrhea too, which was watery stool.  No history of travel or sick contacts at home.  Patient had fever when she presented to the emergency room and also had elevated heart rate.  Her urinalysis showed infection is received was started on IV antibiotics.  Patient also has some chills.  Her flu test was pending and lactic acid was also pending.  Has chest discomfort whenever she takes a deep breath.  Hospitalist service was consulted for further care.  Patient was worked up with CT kidney stone protocol which did not show any nephrolithiasis or obstruction.  PAST MEDICAL HISTORY:   Past Medical History:  Diagnosis Date  . Gestational diabetes   . Hx of gallstones 89ys old   LAP removal of stones  . Obesity, Class III, BMI 40-49.9 (morbid obesity) (HCC)   . Pre-diabetes     PAST SURGICAL HISTORY:  Past Surgical History:  Procedure Laterality Date  . LAPAROSCOPY  32 yo   gallstones removed    SOCIAL HISTORY:  Social History   Tobacco Use  . Smoking status: Current Every Day Smoker    Packs/day: 1.00    Types: Cigarettes  . Smokeless tobacco: Never Used  Substance Use Topics  . Alcohol use: No    Alcohol/week: 0.0 oz    FAMILY HISTORY:  Family History  Problem Relation Age of Onset  . Hypertension Mother   . Diabetes Sister   . Migraines Sister     DRUG ALLERGIES: No Known Allergies  REVIEW OF  SYSTEMS:   CONSTITUTIONAL: Has fever, has fatigue and weakness.  EYES: No blurred or double vision.  EARS, NOSE, AND THROAT: No tinnitus or ear pain.  RESPIRATORY: No cough, shortness of breath, wheezing or hemoptysis.  CARDIOVASCULAR: No chest pain, orthopnea, edema.  GASTROINTESTINAL: Has nausea, vomiting, diarrhea  no abdominal pain.  GENITOURINARY: Has dysuria,  No hematuria.  ENDOCRINE: No polyuria, nocturia,  HEMATOLOGY: No anemia, easy bruising or bleeding SKIN: No rash or lesion. MUSCULOSKELETAL: No joint pain or arthritis.   NEUROLOGIC: No tingling, numbness, weakness.  PSYCHIATRY: No anxiety or depression.   MEDICATIONS AT HOME:  Prior to Admission medications   Medication Sig Start Date End Date Taking? Authorizing Provider  cyclobenzaprine (FLEXERIL) 5 MG tablet Take 1 tablet (5 mg total) by mouth 3 (three) times daily as needed for muscle spasms. Patient not taking: Reported on 05/14/2017 12/09/16   Menshew, Charlesetta IvoryJenise V Bacon, PA-C  ibuprofen (ADVIL,MOTRIN) 800 MG tablet Take 1 tablet (800 mg total) by mouth every 8 (eight) hours as needed. Patient not taking: Reported on 05/14/2017 03/14/17   Enid DerryWagner, Ashley, PA-C      PHYSICAL EXAMINATION:   VITAL SIGNS: Blood pressure 104/61, pulse (!) 148, temperature 100.3 F (37.9 C), temperature source Oral, resp. rate 18, height 5\' 3"  (1.6 m), weight 120.2 kg (265 lb), last menstrual period 04/25/2017, SpO2 99 %, unknown if currently breastfeeding.  GENERAL:  31  y.o.-year-old patient lying in the bed with no acute distress.  EYES: Pupils equal, round, reactive to light and accommodation. No scleral icterus. Extraocular muscles intact.  HEENT: Head atraumatic, normocephalic. Oropharynx dry and nasopharynx clear.  NECK:  Supple, no jugular venous distention. No thyroid enlargement, no tenderness.  LUNGS: Normal breath sounds bilaterally, no wheezing, rales,rhonchi or crepitation. No use of accessory muscles of respiration.   CARDIOVASCULAR: S1, S2 tachycardia noted. No murmurs, rubs, or gallops.  ABDOMEN: Soft, nontender, nondistended. Bowel sounds present. No organomegaly or mass.  EXTREMITIES: No pedal edema, cyanosis, or clubbing.  NEUROLOGIC: Cranial nerves II through XII are intact. Muscle strength 5/5 in all extremities. Sensation intact. Gait not checked.  PSYCHIATRIC: The patient is alert and oriented x 3.  SKIN: No obvious rash, lesion, or ulcer.   LABORATORY PANEL:   CBC Recent Labs  Lab 05/14/17 1553  WBC 22.9*  HGB 13.4  HCT 42.9  PLT 308  MCV 81.2  MCH 25.5*  MCHC 31.3*  RDW 15.5*   ------------------------------------------------------------------------------------------------------------------  Chemistries  Recent Labs  Lab 05/14/17 1553  NA 128*  K 4.0  CL 96*  CO2 19*  GLUCOSE 342*  BUN 13  CREATININE 1.24*  CALCIUM 9.1  MG 1.3*  AST 28  ALT 16  ALKPHOS 101  BILITOT 1.1   ------------------------------------------------------------------------------------------------------------------ estimated creatinine clearance is 83.3 mL/min (A) (by C-G formula based on SCr of 1.24 mg/dL (H)). ------------------------------------------------------------------------------------------------------------------ Recent Labs    05/14/17 1553  TSH 0.781     Coagulation profile No results for input(s): INR, PROTIME in the last 168 hours. ------------------------------------------------------------------------------------------------------------------- No results for input(s): DDIMER in the last 72 hours. -------------------------------------------------------------------------------------------------------------------  Cardiac Enzymes Recent Labs  Lab 05/14/17 1553  TROPONINI <0.03   ------------------------------------------------------------------------------------------------------------------ Invalid input(s):  POCBNP  ---------------------------------------------------------------------------------------------------------------  Urinalysis    Component Value Date/Time   COLORURINE AMBER (A) 05/14/2017 1812   APPEARANCEUR CLOUDY (A) 05/14/2017 1812   APPEARANCEUR Cloudy (A) 03/06/2015 1141   LABSPEC 1.026 05/14/2017 1812   PHURINE 5.0 05/14/2017 1812   GLUCOSEU >=500 (A) 05/14/2017 1812   HGBUR MODERATE (A) 05/14/2017 1812   BILIRUBINUR NEGATIVE 05/14/2017 1812   BILIRUBINUR negative 10/06/2015 1537   BILIRUBINUR Negative 03/06/2015 1141   KETONESUR 20 (A) 05/14/2017 1812   PROTEINUR 100 (A) 05/14/2017 1812   UROBILINOGEN negative 10/06/2015 1537   NITRITE POSITIVE (A) 05/14/2017 1812   LEUKOCYTESUR TRACE (A) 05/14/2017 1812   LEUKOCYTESUR Negative 03/06/2015 1141     RADIOLOGY: Dg Chest 2 View  Result Date: 05/14/2017 CLINICAL DATA:  Central chest pain for 2 days. Intermittent shortness of breath. EXAM: CHEST - 2 VIEW COMPARISON:  None. FINDINGS: The heart size and mediastinal contours are normal. The lungs are clear. There is no pleural effusion or pneumothorax. The bones appear unremarkable. The patient appears obese. Telemetry leads overlie the chest. IMPRESSION: No active cardiopulmonary process. Electronically Signed   By: Carey Bullocks M.D.   On: 05/14/2017 17:04   Ct Renal Stone Study  Result Date: 05/14/2017 CLINICAL DATA:  Nausea, vomiting and diarrhea. EXAM: CT ABDOMEN AND PELVIS WITHOUT CONTRAST TECHNIQUE: Multidetector CT imaging of the abdomen and pelvis was performed following the standard protocol without IV contrast. COMPARISON:  None. FINDINGS: Lower chest: Minimal linear atelectasis over the right middle lobe. Subcentimeter lymph node over the right pericardiophrenic region. Hepatobiliary: Previous cholecystectomy. Liver and biliary tree are normal. Pancreas: Normal. Spleen: Normal. Adrenals/Urinary Tract: Adrenal glands are normal. Kidneys normal in size without  hydronephrosis or nephrolithiasis. Ureters and bladder  are normal. Stomach/Bowel: Stomach and small bowel are normal. Appendix is normal. Colon is within normal. Vascular/Lymphatic: Normal. Reproductive: Uterus just right of midline.  Ovaries unremarkable. Other: No free fluid or focal inflammatory change. Musculoskeletal: Within normal. IMPRESSION: No acute findings in the abdomen/pelvis. Electronically Signed   By: Elberta Fortis M.D.   On: 05/14/2017 19:46    EKG: Orders placed or performed during the hospital encounter of 05/14/17  . ED EKG within 10 minutes  . ED EKG within 10 minutes    IMPRESSION AND PLAN:  32 year old female patient with history of gestational diabetes, cholelithiasis presented to the emergency room with nausea vomiting diarrhea, fever.  1.  Acute viral gastroenteritis IV fluid hydration  2.  Urinary tract infection IV Rocephin antibiotic  Follow-up cultures  3.  Sepsis secondary to UTI IV fluids IV antibiotics  4.  Hyponatremia IV fluid hydration  5.  Tobacco abuse Tobacco cessation counseled to the patient 6 minutes Nicotine patch offered to the patient Harmful effects of smoking also discussed  6. hyperglycemia Controlled blood sugars with sliding scale coverage  All the records are reviewed and case discussed with ED provider. Management plans discussed with the patient, family and they are in agreement.  CODE STATUS:FULL CODE Code Status History    Date Active Date Inactive Code Status Order ID Comments User Context   10/12/2015 0121 10/13/2015 2130 Full Code [id number]  Hildred Laser, MD Inpatient   10/10/2015 2200 10/12/2015 0121 Full Code [id number]  Hildred Laser, MD Inpatient       TOTAL TIME TAKING CARE OF THIS PATIENT: 51 minutes.    Ihor Austin M.D on 05/14/2017 at 8:46 PM  Between 7am to 6pm - Pager - [phone]  After 6pm go to [email or web address] - password [email or web address]  [facility]  Office   [phone]  CC: Primary care physician; System, Pcp Not In

## 2017-05-14 NOTE — Progress Notes (Addendum)
lovenox changed to 40 mg BID for CrCl >30 and BMI >40. 

## 2017-05-14 NOTE — ED Notes (Signed)
Delay for report d/t attempts for IV access

## 2017-05-14 NOTE — Progress Notes (Signed)
Pharmacy Antibiotic Note  [name] Wolf is a 32 y.o. female admitted on 05/14/2017 with UTI.  Pharmacy has been consulted for ceftriaxone dosing.  Plan: Ceftriaxone 1 gm IV Q24H  Height: 5\' 3"  (160 cm) Weight: 265 lb (120.2 kg) IBW/kg (Calculated) : 52.4  Temp (24hrs), Avg:99.7 F (37.6 C), Min:99.1 F (37.3 C), Max:100.3 F (37.9 C)  Recent Labs  Lab 05/14/17 1553  WBC 22.9*  CREATININE 1.24*    Estimated Creatinine Clearance: 83.3 mL/min (A) (by C-G formula based on SCr of 1.24 mg/dL (H)).    No Known Allergies  Antimicrobials this admission:   Dose adjustments this admission:   Microbiology results:  BCx:   UCx:    Sputum:    MRSA PCR:  Thank you for allowing pharmacy to be a part of this patient's care.  [name] FrostNathan A Zuleima Wolf, Pharm.D., BCPS Clinical Pharmacist 05/14/2017 8:07 PM

## 2017-05-14 NOTE — ED Triage Notes (Signed)
PT to ED via POV with c/o chest pain x2 days, describes pain as tightness. Pt tearful in triage. A&Ox4,

## 2017-05-14 NOTE — ED Provider Notes (Addendum)
[facility] Emergency Department Provider Note  ____________________________________________   I have reviewed the triage vital signs and the nursing notes. Where available I have reviewed prior notes and, if possible and indicated, outside hospital notes.    HISTORY  Chief Complaint Chest Pain    HPI [name] Wolf is a 32 y.o. female history of morbid obesity, cholecystectomy prediabetes, whose child has been sick with diarrheal illness since last week, patient herself then began having nausea vomiting diarrhea over the last couple days, which became just vomiting today.  She has not had a fever or chills.  She states it hurts her chest when she vomits.  She denies any focal abdominal pain but she states she feels "a little sore" she has not had a fever.  She has not had any melena or bright red blood per rectum or hematemesis.  She has vomited multiple times today.  She states that she has had ongoing symptoms for the last couple days and she is feeling dehydrated.  Not tried anything at home.   Past Medical History:  Diagnosis Date  . Gestational diabetes   . Hx of gallstones [age]ys old   LAP removal of stones  . Obesity, Class III, BMI 40-49.9 (morbid obesity) (HCC)   . Pre-diabetes     Patient Active Problem List   Diagnosis Date Noted  . Anemia of pregnancy 10/12/2015  . Family history of congenital anomalies   . Gestational diabetes mellitus (GDM) in third trimester controlled on oral hypoglycemic drug 06/03/2015  . Family or maternal historic risk of congenital anomaly 04/10/2015  . Tobacco abuse 04/10/2015  . Genetic screening 04/10/2015  . Pre-diabetes 04/10/2015  . Rubella non-immune status, antepartum 04/10/2015  . Acanthosis nigricans 05/08/2013  . Acid reflux 05/08/2013  . Snores 05/08/2013    Past Surgical History:  Procedure Laterality Date  . LAPAROSCOPY  32 yo   gallstones removed    Prior to Admission medications    Medication Sig Start Date End Date Taking? Authorizing Provider  cyclobenzaprine (FLEXERIL) 5 MG tablet Take 1 tablet (5 mg total) by mouth 3 (three) times daily as needed for muscle spasms. 12/09/16   Menshew, Charlesetta IvoryJenise V Bacon, PA-C  docusate sodium (COLACE) 100 MG capsule Take 1 capsule (100 mg total) by mouth 2 (two) times daily as needed. 10/13/15   Hildred Laserherry, Anika, MD  ferrous sulfate (FERROUSUL) 325 (65 FE) MG tablet Take 1 tablet (325 mg total) by mouth 2 (two) times daily. 10/13/15   Hildred Laserherry, Anika, MD  ibuprofen (ADVIL,MOTRIN) 800 MG tablet Take 1 tablet (800 mg total) by mouth every 8 (eight) hours as needed. 03/14/17   Enid DerryWagner, Ashley, PA-C  Prenatal Vit-Fe Fumarate-FA (PNV PRENATAL PLUS MULTIVITAMIN) 27-1 MG TABS Take 1 tablet by mouth daily. 02/12/15   [provider]    Allergies Patient has no known allergies.  Family History  Problem Relation Age of Onset  . Hypertension Mother   . Diabetes Sister   . Migraines Sister     Social History Social History   Tobacco Use  . Smoking status: Current Every Day Smoker    Packs/day: 1.00    Types: Cigarettes  . Smokeless tobacco: Never Used  Substance Use Topics  . Alcohol use: No    Alcohol/week: 0.0 oz  . Drug use: No    Review of Systems Constitutional: No fever/chills Eyes: No visual changes. ENT: No sore throat. No stiff neck no neck pain Cardiovascular: States that it hurts her chest wall  when she vomits and she can also elicit this pain by touching her chest wall or changing position.  When she indicates this area she also indicates her epigastric region which again hurts with vomiting Respiratory: Denies shortness of breath. Gastrointestinal: See HPI genitourinary: Negative for dysuria. Musculoskeletal: Negative lower extremity swelling Skin: Negative for rash. Neurological: Negative for severe headaches, focal weakness or numbness.   ____________________________________________   PHYSICAL EXAM:  VITAL  SIGNS: ED Triage Vitals  Enc Vitals Group     BP 05/14/17 1538 113/87     Pulse Rate 05/14/17 1538 (!) 150     Resp 05/14/17 1538 16     Temp 05/14/17 1536 99.1 F (37.3 C)     Temp Source 05/14/17 1536 Oral     SpO2 05/14/17 1538 96 %     Weight 05/14/17 1536 265 lb (120.2 kg)     Height 05/14/17 1536 5\' 3"  (1.6 m)     Head Circumference --      Peak Flow --      Pain Score 05/14/17 1535 8     Pain Loc --      Pain Edu? --      Excl. in GC? --     Constitutional: Alert and oriented. Well appearing and in no acute distress. Eyes: Conjunctivae are normal Head: Atraumatic HEENT: No congestion/rhinnorhea. Mucous membranes are dry.  Oropharynx non-erythematous Neck:   Nontender with no meningismus, no masses, no stridor Cardiovascular: Tachycardia noted regular rhythm. Grossly normal heart sounds.  Good peripheral circulation. Chest: Tender palpation of the chest wall anteriorly, no rib fractures or crepitus or flail chest palpated.  When I touch this area patient states "ouch that the pain right there" and pulls back.  Very reproducible pain. Respiratory: Normal respiratory effort.  No retractions. Lungs CTAB. Abdominal: Soft and nontender. No distention. No guarding no rebound overly obese but no focal numbness. Back:  There is no focal tenderness or step off.  there is no midline tenderness there are no lesions noted. there is no CVA tenderness Musculoskeletal: No lower extremity tenderness, no upper extremity tenderness. No joint effusions, no DVT signs strong distal pulses no edema Neurologic:  Normal speech and language. No gross focal neurologic deficits are appreciated.  Skin:  Skin is warm, dry and intact. No rash noted. Psychiatric: Mood and affect are quite anxious. Speech and behavior are normal.  ____________________________________________   LABS (all labs ordered are listed, but only abnormal results are displayed)  Labs Reviewed  CBC - Abnormal; Notable for the  following components:      Result Value   WBC 22.9 (*)    RBC 5.28 (*)    MCH 25.5 (*)    MCHC 31.3 (*)    RDW 15.5 (*)    All other components within normal limits  BASIC METABOLIC PANEL  TROPONIN I  HEPATIC FUNCTION PANEL  LIPASE, BLOOD  URINALYSIS, COMPLETE (UACMP) WITH MICROSCOPIC  POC URINE PREG, ED    Pertinent labs  results that were available during my care of the patient were reviewed by me and considered in my medical decision making (see chart for details). ____________________________________________  EKG  I personally interpreted any EKGs ordered by me or triage Patient here with EKG which shows sinus tach, rate 148 bpm, no acute ST elevation or depression, repeat EKG shortly thereafter shows sinus tach at 139 no acute ST elevation or depression normal axis ____________________________________________  RADIOLOGY  Pertinent labs & imaging results that were available  during my care of the patient were reviewed by me and considered in my medical decision making (see chart for details). If possible, patient and/or family made aware of any abnormal findings.  No results found. ____________________________________________    PROCEDURES  Procedure(s) performed: None  Procedures  Critical Care performed: CRITICAL CARE Performed by: Jeanmarie Plant   Total critical care time: 45 minutes  Critical care time was exclusive of separately billable procedures and treating other patients.  Critical care was necessary to treat or prevent imminent or life-threatening deterioration.  Critical care was time spent personally by me on the following activities: development of treatment plan with patient and/or surrogate as well as nursing, discussions with consultants, evaluation of patient's response to treatment, examination of patient, obtaining history from patient or surrogate, ordering and performing treatments and interventions, ordering and review of laboratory studies,  ordering and review of radiographic studies, pulse oximetry and re-evaluation of patient's condition.   ____________________________________________   INITIAL IMPRESSION / ASSESSMENT AND PLAN / ED COURSE  Pertinent labs & imaging results that were available during my care of the patient were reviewed by me and considered in my medical decision making (see chart for details).  Here with nausea vomiting and diarrhea, positive sick contacts with similar, very strong community burden of viral illness.  Abdomen is nonsurgical at this time.  Patient is resting comfortably in the bed.  She has received antiemetics.  She does have reproducible chest wall pain after vomiting I do not think this likely represents ACS PE or dissection but we did do an EKG which shows no acute ischemia and I will obtain a chest x-ray, low suspicion for Boerhaave's syndrome or other acute intrathoracic pathology.  In addition, abdomen at this time is benign.  She was quite tachycardic she was also somewhat anxious.  Likely a mixed picture but certainly volume depletion is playing a sick significant role.  I initially was concerned about the possibility of an a flutter but as patient relaxes and calms down her heart rate went down to 130s pretty rapidly, which is certainly not consistent with atrial flutter.  Sinus tach in my opinion at this time.  We will give the patient IV fluid and we will check basic blood work as a precaution and will reassess.  I do anticipate a high white count.  ----------------------------------------- 5:21 PM on 05/14/2017 -----------------------------------------  Is feeling better not vomiting here abdomen benign, blood work is noted.  Personally reviewed x-ray.  Heart rate is come down considerably, still somewhat tachycardic in the 130s, getting IV fluid.  We will continue to monitor her closely here.  ----------------------------------------- 6:55 PM on  05/14/2017 ----------------------------------------- Patient still feels unwell, heart rate is back up in the 140s TSH is pending, she likely is pretty dehydrated in part because she has new elevated sugars which are atypically high for her.  We are giving her insulin to bring that down.  She does not appear to be septic by her pressures, however I am concerned about this persistent elevated heart rate which is likely combination of dehydration and the illness that affects her.  Initially, this very much did seem like routine nausea vomiting diarrhea but despite her bags of fluids so far, at least a liter and a half, she still quite tachycardic.  We are giving her more fluid she does have a very positional IV and continually bends her elbow making it necessary to remind her to keep it straight so we can continue  hydrating her.  She is not complaining of any particular pain at this time.  Her abdomen is benign.  She does endorse however history of dysuria that she did not mention initially and her urine shows evidence of a possible urinary tract infection.  We will treat this with Rocephin and given her morbid obesity limiting my exam and her persistent tachycardia will obtain a CT scan to rule out other possible pathology.  He does not have an anion gap but she does have mild ketosis which is more likely from diarrhea than true DKA.  This does appear to be a sinus tachycardia.  We will send a lactic acid as a precaution, will obtain blood cultures and give antibiotics  ----------------------------------------- 8:23 PM on 05/14/2017 -----------------------------------------  We have repeatedly tried to get better fluid hydration we are going to place an ultrasound-guided IV as her heart rate is still up, we have only accomplished 1.5 L of fluid it turns out, we are going to do an ultrasound-guided repeat IV because 1 of them is flowing slowly and the other one seems to be positional we can not seem to keep  her arm straight.  The patient however will require ultrasound-guided IV given significant obesity.  She continues to mentate clearly and be in no acute distress she has had some vomiting here we are giving her antiemetics.  Influenza is test pending.  We are obviously admitting her to the hospital.  Lactic is negative but we will call her sepsis given her findings here.  Low suspicion for PE.  And this is a sinus tach on 2 different EKGs.  Timestamp   ----------------------------------------- 8:53 PM on 05/14/2017 -----------------------------------------  Patient's feeling better now that her temperature is down, her heart rate is in the 130s again now that we have better fluid going, lactic acid is 1.6 which is very reassuring and consistent with her physical exam, we will recheck sugar on her, her flu is pending, signed out to Dr. Rosann Auerbach at the end of my shift the patient is being admitted to the hospitalist service    ____________________________________________   FINAL CLINICAL IMPRESSION(S) / ED DIAGNOSES  Final diagnoses:  None      This chart was dictated using voice recognition software.  Despite best efforts to proofread,  errors can occur which can change meaning.      Jeanmarie Plant, MD 05/14/17 1638    Jeanmarie Plant, MD 05/14/17 1722    Jeanmarie Plant, MD 05/14/17 1857    Jeanmarie Plant, MD 05/14/17 Gweneth Fritter, MD 05/14/17 2024    Jeanmarie Plant, MD 05/14/17 2054

## 2017-05-15 LAB — BASIC METABOLIC PANEL
Anion gap: 8 (ref 5–15)
BUN: 10 mg/dL (ref 6–20)
CALCIUM: 7.8 mg/dL — AB (ref 8.9–10.3)
CO2: 20 mmol/L — ABNORMAL LOW (ref 22–32)
Chloride: 105 mmol/L (ref 101–111)
Creatinine, Ser: 0.97 mg/dL (ref 0.44–1.00)
GFR calc Af Amer: >60 mL/min (ref >60)
Glucose, Bld: 301 mg/dL — ABNORMAL HIGH (ref 65–99)
Potassium: 3.7 mmol/L (ref 3.5–5.1)
SODIUM: 133 mmol/L — AB (ref 135–145)

## 2017-05-15 LAB — HEMOGLOBIN A1C
Hgb A1c MFr Bld: 8.3 % — ABNORMAL HIGH (ref 4.8–5.6)
Mean Plasma Glucose: 191.51 mg/dL

## 2017-05-15 LAB — CBC
HCT: 34.7 % — ABNORMAL LOW (ref 35.0–47.0)
Hemoglobin: 10.8 g/dL — ABNORMAL LOW (ref 12.0–16.0)
MCH: 25.7 pg — AB (ref 26.0–34.0)
MCHC: 31.3 g/dL — AB (ref 32.0–36.0)
MCV: 82.1 fL (ref 80.0–100.0)
PLATELETS: 207 10*3/uL (ref 150–440)
RBC: 4.22 MIL/uL (ref 3.80–5.20)
RDW: 15 % — AB (ref 11.5–14.5)
WBC: 17.4 10*3/uL — ABNORMAL HIGH (ref 3.6–11.0)

## 2017-05-15 LAB — GLUCOSE, CAPILLARY
Glucose-Capillary: 233 mg/dL — ABNORMAL HIGH (ref 65–99)
Glucose-Capillary: 318 mg/dL — ABNORMAL HIGH (ref 65–99)
Glucose-Capillary: 228 mg/dL — ABNORMAL HIGH (ref 65–99)
Glucose-Capillary: 171 mg/dL — ABNORMAL HIGH (ref 65–99)

## 2017-05-15 MED ORDER — INSULIN GLARGINE 100 UNIT/ML ~~LOC~~ SOLN
10.0000 [IU] | Freq: Every day | SUBCUTANEOUS | Status: DC
  Administered 2017-05-15: 10 [IU] via SUBCUTANEOUS
  Filled 2017-05-15 (×2): qty 0.1

## 2017-05-15 MED ORDER — SODIUM CHLORIDE 0.9 % IV BOLUS (SEPSIS)
1000.0000 mL | Freq: Once | INTRAVENOUS | Status: AC
Start: 2017-05-15 — End: 2017-05-15
  Administered 2017-05-15: 1000 mL via INTRAVENOUS

## 2017-05-15 MED ORDER — PIPERACILLIN-TAZOBACTAM 3.375 G IVPB
3.3750 g | Freq: Three times a day (TID) | INTRAVENOUS | Status: DC
  Administered 2017-05-15 – 2017-05-16 (×4): 3.375 g via INTRAVENOUS
  Filled 2017-05-15 (×4): qty 50

## 2017-05-15 MED ORDER — KETOROLAC TROMETHAMINE 30 MG/ML IJ SOLN
30.0000 mg | Freq: Four times a day (QID) | INTRAMUSCULAR | Status: DC | PRN
  Administered 2017-05-15: 30 mg via INTRAVENOUS
  Filled 2017-05-15: qty 1

## 2017-05-15 MED ORDER — SODIUM CHLORIDE 0.9 % IV BOLUS (SEPSIS)
1000.0000 mL | Freq: Once | INTRAVENOUS | Status: AC
  Administered 2017-05-15: 1000 mL via INTRAVENOUS

## 2017-05-15 MED ORDER — LIVING WELL WITH DIABETES BOOK
Freq: Once | Status: AC
  Administered 2017-05-15: 18:00:00
  Filled 2017-05-15: qty 1

## 2017-05-15 MED ORDER — INSULIN GLARGINE 100 UNIT/ML ~~LOC~~ SOLN
10.0000 [IU] | Freq: Every day | SUBCUTANEOUS | Status: DC
  Filled 2017-05-15: qty 0.1

## 2017-05-15 MED ORDER — PROCHLORPERAZINE EDISYLATE 5 MG/ML IJ SOLN
10.0000 mg | Freq: Four times a day (QID) | INTRAMUSCULAR | Status: DC | PRN
  Administered 2017-05-15: 10 mg via INTRAVENOUS
  Filled 2017-05-15 (×2): qty 2

## 2017-05-15 NOTE — Progress Notes (Signed)
[employer] - [place] [facility]   PATIENT NAME: [name] Wolf    MR#:  [id number]  DATE OF BIRTH:  25-Aug-1985  SUBJECTIVE:   Patient here due to sepsis secondary to urinary tract infection/pyelonephritis.  Patient was significantly tachycardic overnight and received multiple IV fluid boluses.  Patient's blood sugars also quite uncontrolled.  Patient actually asking if she could go home.  REVIEW OF SYSTEMS:    Review of Systems  Constitutional: Negative for chills and fever.  HENT: Negative for congestion and tinnitus.   Eyes: Negative for blurred vision and double vision.  Respiratory: Negative for cough, shortness of breath and wheezing.   Cardiovascular: Negative for chest pain, orthopnea and PND.  Gastrointestinal: Negative for abdominal pain, diarrhea, nausea and vomiting.  Genitourinary: Negative for dysuria and hematuria.  Neurological: Positive for weakness. Negative for dizziness, sensory change and focal weakness.  All other systems reviewed and are negative.   Nutrition: Regular Tolerating Diet: Yes Tolerating PT: Ambulatory  DRUG ALLERGIES:  No Known Allergies  VITALS:  Blood pressure 98/60, pulse (!) 113, temperature 97.8 F (36.6 C), temperature source Oral, resp. rate 20, height 5\' 3"  (1.6 m), weight 120.2 kg (265 lb), last menstrual period 04/25/2017, SpO2 98 %, unknown if currently breastfeeding.  PHYSICAL EXAMINATION:   Physical Exam  GENERAL:  32 y.o.-year-old obese patient lying in bed in no acute distress.  EYES: Pupils equal, round, reactive to light and accommodation. No scleral icterus. Extraocular muscles intact.  HEENT: Head atraumatic, normocephalic. Oropharynx and nasopharynx clear.  NECK:  Supple, no jugular venous distention. No thyroid enlargement, no tenderness.  LUNGS: Normal breath sounds bilaterally, no wheezing, rales, rhonchi. No use of accessory muscles of respiration.  CARDIOVASCULAR: S1, S2 normal. No murmurs,  rubs, or gallops.  ABDOMEN: Soft, nontender, nondistended. Bowel sounds present. No organomegaly or mass.  EXTREMITIES: No cyanosis, clubbing or edema b/l.    NEUROLOGIC: Cranial nerves II through XII are intact. No focal Motor or sensory deficits b/l.  Globally weak.  PSYCHIATRIC: The patient is alert and oriented x 3.  SKIN: No obvious rash, lesion, or ulcer.    LABORATORY PANEL:   CBC Recent Labs  Lab 05/15/17 0340  WBC 17.4*  HGB 10.8*  HCT 34.7*  PLT 207   ------------------------------------------------------------------------------------------------------------------  Chemistries  Recent Labs  Lab 05/14/17 1553 05/15/17 0340  NA 128* 133*  K 4.0 3.7  CL 96* 105  CO2 19* 20*  GLUCOSE 342* 301*  BUN 13 10  CREATININE 1.24* 0.97  CALCIUM 9.1 7.8*  MG 1.3*  --   AST 28  --   ALT 16  --   ALKPHOS 101  --   BILITOT 1.1  --    ------------------------------------------------------------------------------------------------------------------  Cardiac Enzymes Recent Labs  Lab 05/14/17 2128  TROPONINI <0.03   ------------------------------------------------------------------------------------------------------------------  RADIOLOGY:  Dg Chest 2 View  Result Date: 05/14/2017 CLINICAL DATA:  Central chest pain for 2 days. Intermittent shortness of breath. EXAM: CHEST - 2 VIEW COMPARISON:  None. FINDINGS: The heart size and mediastinal contours are normal. The lungs are clear. There is no pleural effusion or pneumothorax. The bones appear unremarkable. The patient appears obese. Telemetry leads overlie the chest. IMPRESSION: No active cardiopulmonary process. Electronically Signed   By: Carey Bullocks M.D.   On: 05/14/2017 17:04   Ct Renal Stone Study  Result Date: 05/14/2017 CLINICAL DATA:  Nausea, vomiting and diarrhea. EXAM: CT ABDOMEN AND PELVIS WITHOUT CONTRAST TECHNIQUE: Multidetector CT imaging of the abdomen and  pelvis was performed following the standard  protocol without IV contrast. COMPARISON:  None. FINDINGS: Lower chest: Minimal linear atelectasis over the right middle lobe. Subcentimeter lymph node over the right pericardiophrenic region. Hepatobiliary: Previous cholecystectomy. Liver and biliary tree are normal. Pancreas: Normal. Spleen: Normal. Adrenals/Urinary Tract: Adrenal glands are normal. Kidneys normal in size without hydronephrosis or nephrolithiasis. Ureters and bladder are normal. Stomach/Bowel: Stomach and small bowel are normal. Appendix is normal. Colon is within normal. Vascular/Lymphatic: Normal. Reproductive: Uterus just right of midline.  Ovaries unremarkable. Other: No free fluid or focal inflammatory change. Musculoskeletal: Within normal. IMPRESSION: No acute findings in the abdomen/pelvis. Electronically Signed   By: Elberta Fortisaniel  Boyle M.D.   On: 05/14/2017 19:46     ASSESSMENT AND PLAN:   32 year old female with past medical history of obesity, history of gallstones, gestational diabetes/prediabetes who presented to the hospital due to nausea vomiting and diarrhea, fever and suspected to have sepsis secondary to UTI.  1.  Sepsis-patient meets criteria given her fever, tachycardia, leukocytosis and abnormal urinalysis. - Continue IV fluids, IV antibiotics with meropenem.  Follow urine cultures.  Follow fever curve.  2.  Urinary tract infection-this is a source of patient's sepsis.  Continue IV meropenem, follow cultures.  3.  Uncontrolled diabetes with hyperglycemia- patient has prediabetes but her blood sugars are significantly uncontrolled here. -We will check a hemoglobin A 1C,  get a diabetes coordinator consult.  Start low-dose Lantus, continue sliding scale insulin.  Follow blood sugars.  4.  Leukocytosis-secondary to sepsis/UTI.  Follow with IV antibiotic therapy.  5.  Tobacco abuse-continue nicotine patch.    All the records are reviewed and case discussed with Care Management/Social Worker. Management plans  discussed with the patient, family and they are in agreement.  CODE STATUS: Full code  DVT Prophylaxis: Lovenox  TOTAL TIME TAKING CARE OF THIS PATIENT: 30 minutes.   POSSIBLE D/C IN 1-2 DAYS, DEPENDING ON CLINICAL CONDITION.   [place] SirenSAINANI,Andrej Spagnoli J M.D on 05/15/2017 at 3:10 PM  Between 7am to 6pm - Pager - [phone]  After 6pm go to [email or web address] - Social research officer, governmentpassword [email or web address] [email or web address]  [facility] [place] [facility]  Office  [phone]  CC: Primary care physician; System, Pcp Not In

## 2017-05-15 NOTE — Progress Notes (Signed)
MD notified of high hr. Bolus x 1 ordered. Will give and continue to monitor

## 2017-05-15 NOTE — Progress Notes (Addendum)
Inpatient Diabetes Program Recommendations  AACE/ADA: New Consensus Statement on Inpatient Glycemic Control (2015)  Target Ranges:  Prepandial:   less than 140 mg/dL      Peak postprandial:   less than 180 mg/dL (1-2 hours)      Critically ill patients:  140 - 180 mg/dL   Results for [name], [name] Wolf (MRN [id number]) as of 05/15/2017 12:00  Ref. Range 05/14/2017 18:41 05/14/2017 22:41 05/15/2017 07:54 05/15/2017 11:48  Glucose-Capillary Latest Ref Range: 65 - 99 mg/dL 295275 (H) 284183 (H) 132233 (H) 318 (H)  Results for [name], [name] Wolf (MRN [id number]) as of 05/15/2017 15:51  Ref. Range 05/14/2017 15:53  Beta-Hydroxybutyric Acid Latest Ref Range: 0.05 - 0.27 mmol/L 0.72 (H)  Glucose Latest Ref Range: 65 - 99 mg/dL 366342 (H)   Review of Glycemic Control  Diabetes history: GDM; Prediabetes Outpatient Diabetes medications: None Current orders for Inpatient glycemic control: Novolog 0-20 units TID with meals, Novolog 0-5 units QHS  Inpatient Diabetes Program Recommendations:  Insulin - Basal: Please consider ordering Lantus 10 units Q24H. HgbA1C: Please consider ordering an A1C to evaluate glycemic control over the past 2-3 months.  Addendum 05/15/17@14 :00- Talked with patient regarding glycemic control. Patient states that she was dx with GDM with pregnancy about 1 year ago. Patient states that she took oral DM medications during pregnancy. Following delivery, her glucose was never checked at the follow up visit. Patient reports that [facility] but it has been well over 1 year since she seen physician there. Informed patient that A1C is in process. Discussed initial glucose of 342 mg/dl on presentation to the hospital. Patient endorsed she has been having symptoms of hyperglycemia for several weeks. Will follow back up with patient tomorrow regarding DM education and A1C results.  Thanks, Orlando PennerMarie Trevian Hayashida, RN, MSN, CDE Diabetes Coordinator Inpatient Diabetes  Program [phone] (Team Pager from 8am to 5pm)

## 2017-05-15 NOTE — Progress Notes (Signed)
MD notified of BP and HR. Orders for IVF bolus x 1. Will give and continue to monitor.

## 2017-05-16 LAB — CBC
HCT: 29.3 % — ABNORMAL LOW (ref 35.0–47.0)
Hemoglobin: 9.2 g/dL — ABNORMAL LOW (ref 12.0–16.0)
MCH: 25.6 pg — ABNORMAL LOW (ref 26.0–34.0)
MCHC: 31.4 g/dL — ABNORMAL LOW (ref 32.0–36.0)
MCV: 81.4 fL (ref 80.0–100.0)
PLATELETS: 171 10*3/uL (ref 150–440)
RBC: 3.6 MIL/uL — AB (ref 3.80–5.20)
RDW: 15.1 % — AB (ref 11.5–14.5)
WBC: 8.8 10*3/uL (ref 3.6–11.0)

## 2017-05-16 LAB — BASIC METABOLIC PANEL
ANION GAP: 6 (ref 5–15)
BUN: 9 mg/dL (ref 6–20)
CALCIUM: 7.7 mg/dL — AB (ref 8.9–10.3)
CO2: 20 mmol/L — ABNORMAL LOW (ref 22–32)
Chloride: 106 mmol/L (ref 101–111)
Creatinine, Ser: 0.98 mg/dL (ref 0.44–1.00)
Glucose, Bld: 183 mg/dL — ABNORMAL HIGH (ref 65–99)
POTASSIUM: 3.6 mmol/L (ref 3.5–5.1)
SODIUM: 132 mmol/L — AB (ref 135–145)

## 2017-05-16 LAB — GLUCOSE, CAPILLARY
GLUCOSE-CAPILLARY: 161 mg/dL — AB (ref 65–99)
GLUCOSE-CAPILLARY: 162 mg/dL — AB (ref 65–99)

## 2017-05-16 LAB — HIV ANTIBODY (ROUTINE TESTING W REFLEX): HIV SCREEN 4TH GENERATION: NONREACTIVE

## 2017-05-16 MED ORDER — METFORMIN HCL 500 MG PO TABS: 500.0000 mg | ORAL_TABLET | Freq: Two times a day (BID) | ORAL | 1 refills | Status: DC

## 2017-05-16 MED ORDER — AMOXICILLIN-POT CLAVULANATE 875-125 MG PO TABS: 1.0000 | ORAL_TABLET | Freq: Two times a day (BID) | ORAL | 0 refills | Status: AC

## 2017-05-16 MED ORDER — BLOOD GLUCOSE MONITOR KIT: PACK | 0 refills | Status: DC

## 2017-05-16 MED ORDER — GLIMEPIRIDE 1 MG PO TABS: 1.0000 mg | ORAL_TABLET | Freq: Every day | ORAL | 1 refills | Status: DC

## 2017-05-16 NOTE — Progress Notes (Addendum)
Inpatient Diabetes Program Recommendations  AACE/ADA: New Consensus Statement on Inpatient Glycemic Control (2015)  Target Ranges:  Prepandial:   less than 140 mg/dL      Peak postprandial:   less than 180 mg/dL (1-2 hours)      Critically ill patients:  140 - 180 mg/dL   Results for Wolf Wolf (MRN [id number]) as of 05/16/2017 10:46  Ref. Range 05/15/2017 07:54 05/15/2017 11:48 05/15/2017 16:52 05/15/2017 21:09 05/16/2017 07:58  Glucose-Capillary Latest Ref Range: 65 - 99 mg/dL 409233 (H) 811318 (H) 914228 (H) 171 (H) 161 (H)  Results for Wolf Wolf (MRN [id number]) as of 05/16/2017 10:46  Ref. Range 05/15/2017 03:40  Hemoglobin A1C Latest Ref Range: 4.8 - 5.6 % 8.3 (H)   Review of Glycemic Control  Diabetes history: GDM; Prediabetes Outpatient Diabetes medications: NA Current orders for Inpatient glycemic control: Lantus 10 units QHS, Novolog 0-20 units TID with meals, Novolog 0-5 units QHS  Inpatient Diabetes Program Recommendations:  HgbA1C: A1C 8.3% on 05/15/17 indicating an average glucose of 192 mg/dl over the past 2-3 months. Recommend discharging patient on Metformin 500 mg BID and Amaryl 1 mg daily and have patient follow up outpatient.  Outpatient Referral: Per chart, patient does not have PCP or insurance. Therefore will need affordable medications and will need assistance with arranging follow up care by CM.  Will plan to talk with patient today regarding new DM dx.  Addendum 05/16/17@11 :15-Spoke with patient about new diabetes diagnosis. Patient has a history of GDM about 1 year ago and states that she took Glyburide during pregnancy for glycemic control.  Discussed A1C results (8.3% on 05/15/2017) and explained what an A1C is and informed patient that her current A1C indicates an average glucose of 192 mg/dl over the past 2-3 months. Discussed basic pathophysiology of DM Type 2, basic home care, importance of checking CBGs and maintaining good CBG control to prevent long-term and  short-term complications. Reviewed glucose and A1C goals.  Reviewed signs and symptoms of hyperglycemia and hypoglycemia along with treatment for both. Discussed impact of nutrition, exercise, stress, sickness, and medications on diabetes control. Reviewed Living Well with diabetes booklet and encouraged patient to read through entire book.  Patient reports that she use to check her glucose when she was pregnant and she still has an Accucheck glucometer but will need a lancet device and test strips. Name brand glucometer strips are usually at least $1 per strip. Informed patient she could purchase a Reli-On Prime glucometer for $9 and a box of 50 Reli-On test strips for $9 [facility] which may be more affordable. Asked patient to check her glucose at least 2 times per day and to be sure to take her glucometer with her to follow up visits so adjustments could be made with DM medications if necessary.  Discussed Metformin and Amaryl medication and how they both work for DM control. Informed patient that both Metformin and Amaryl are affordable medications that can be [facility]. Patient has not seen her prior PCP in over 1 year. Will let CM know so patient can be assisted with arranging follow up.   Patient verbalized understanding of information discussed and she states that she has no further questions at this time related to diabetes.   RNs to provide ongoing basic DM education at bedside with this patient and engage patient to actively check blood glucose.  Thanks, Orlando PennerMarie Shaan Rhoads, RN, MSN, CDE Diabetes Coordinator Inpatient Diabetes Program [phone] (Team Pager from 8am to 5pm)

## 2017-05-16 NOTE — Plan of Care (Signed)
  RD consulted for nutrition education regarding diabetes.  Lab Results  Component Value Date   HGBA1C 8.3 (H) 05/15/2017   Met with patient at bedside. She reports she previously met with a dietitian when she had gestational diabetes. Her sister also has diabetes. She typically has a good appetite and eats 2 meals per day and a snack. Today for breakfast she had an omelette and some of her fruit cup. She typically skips breakfast at home. For lunch she has a tuna sandwich or barbecue. Dinner is usually [place] chicken with sides such as macaroni and cheese or broccoli and cheese. Her snack is usually a peanut butter and jelly sandwich. She drinks a lot of fruit juice during the day and reports she does not drink enough water. No subcutaneous fat or muscle wasting. RD obtained bed scale weight of 126.3 kg, which is stable from weight in 2017.  RD provided "Carbohydrate Counting for People with Diabetes" handout from the Academy of Nutrition and Dietetics. Discussed different food groups and their effects on blood sugar, emphasizing carbohydrate-containing foods. Provided list of carbohydrates and recommended serving sizes of common foods.  RD provided "Using Nutrition Labels: Carbohydrates" handout from the Academy of Nutrition and Dietetics. Discussed how to read a nutrition label including looking at serving size and servings per container. Reviewed that there are 15 grams of carbohydrate in one carbohydrate choice. Encouraged patient to use chart for range of carbohydrate grams per choice when reading nutrition labels.  Discussed importance of controlled and consistent carbohydrate intake throughout the day. Provided examples of ways to balance meals/snacks and encouraged intake of high-fiber, whole grain complex carbohydrates. Teach back method used.  Expect fair compliance. Patient reports she would like to meet again with an outpatient RD to further discuss her diet.  Body mass index is 46.94  kg/m. Pt meets criteria for Obesity Class III (morbid obesity) based on current BMI.  Current diet order is Regular, patient is consuming approximately 100% of meals at this time. Recommend putting patient on carbohydrate modified diet. Labs and medications reviewed. No further nutrition interventions warranted at this time. RD contact information provided. If additional nutrition issues arise, please re-consult RD.  Willey Blade, MS, [place], LDN Office: [phone] Pager: [phone] After Hours/Weekend Pager: [phone]

## 2017-05-16 NOTE — Care Management Note (Addendum)
Case Management Note  Patient Details  Name: [name] Wolf MRN: [id number] Date of Birth: 10/25/1985  Subjective/Objective:  Met with patient at bedside to discuss discharge planning. She is uninsured. Newly diagnosed diabetes. Application given for medication management and [facility]. Email sent to both agencies. Patient has been to medication management about a year ago. She had no other concerns at this time.                    Action/Plan: [place] and [place] application given.  Faxed prescriptions to medication management clinic.   Expected Discharge Date:  05/17/17               Expected Discharge Plan:  Home/Self Care  In-House Referral:     Discharge planning Services  CM Consult, [facility], Medication Assistance  Post Acute Care Choice:    Choice offered to:  Patient  DME Arranged:    DME Agency:     HH Arranged:    [place] Agency:     Status of Service:  Completed, signed off  If discussed at [employer], dates discussed:    Additional Comments:  Jolly Mango, RN 05/16/2017, 12:03 PM

## 2017-05-17 ENCOUNTER — Emergency Department: Payer: Self-pay

## 2017-05-17 ENCOUNTER — Other Ambulatory Visit: Payer: Self-pay

## 2017-05-17 ENCOUNTER — Encounter: Payer: Self-pay | Admitting: Emergency Medicine

## 2017-05-17 ENCOUNTER — Inpatient Hospital Stay
Admission: EM | Admit: 2017-05-17 | Discharge: 2017-05-18 | DRG: 292 | Discharge status: Against Medical Advice | Payer: Self-pay | Attending: Specialist | Admitting: Specialist

## 2017-05-17 ENCOUNTER — Inpatient Hospital Stay
Admit: 2017-05-17 | Discharge: 2017-05-17 | Disposition: A | Discharge status: Home / Self Care | Payer: Self-pay | Attending: Family Medicine | Admitting: Family Medicine

## 2017-05-17 DIAGNOSIS — J810 Acute pulmonary edema: Secondary | ICD-10-CM

## 2017-05-17 DIAGNOSIS — I509 Heart failure, unspecified: Secondary | ICD-10-CM

## 2017-05-17 DIAGNOSIS — Z6841 Body Mass Index (BMI) 40.0 and over, adult: Secondary | ICD-10-CM

## 2017-05-17 DIAGNOSIS — I5033 Acute on chronic diastolic (congestive) heart failure: Secondary | ICD-10-CM | POA: Diagnosis present

## 2017-05-17 DIAGNOSIS — B029 Zoster without complications: Secondary | ICD-10-CM | POA: Diagnosis present

## 2017-05-17 DIAGNOSIS — Z79899 Other long term (current) drug therapy: Secondary | ICD-10-CM

## 2017-05-17 DIAGNOSIS — R0902 Hypoxemia: Secondary | ICD-10-CM | POA: Diagnosis present

## 2017-05-17 DIAGNOSIS — R0603 Acute respiratory distress: Secondary | ICD-10-CM

## 2017-05-17 DIAGNOSIS — E119 Type 2 diabetes mellitus without complications: Secondary | ICD-10-CM | POA: Diagnosis present

## 2017-05-17 DIAGNOSIS — N390 Urinary tract infection, site not specified: Secondary | ICD-10-CM | POA: Diagnosis present

## 2017-05-17 DIAGNOSIS — Z7984 Long term (current) use of oral hypoglycemic drugs: Secondary | ICD-10-CM

## 2017-05-17 DIAGNOSIS — Z8249 Family history of ischemic heart disease and other diseases of the circulatory system: Secondary | ICD-10-CM

## 2017-05-17 DIAGNOSIS — F17210 Nicotine dependence, cigarettes, uncomplicated: Secondary | ICD-10-CM | POA: Diagnosis present

## 2017-05-17 DIAGNOSIS — B9620 Unspecified Escherichia coli [E. coli] as the cause of diseases classified elsewhere: Secondary | ICD-10-CM | POA: Diagnosis present

## 2017-05-17 DIAGNOSIS — I110 Hypertensive heart disease with heart failure: Principal | ICD-10-CM | POA: Diagnosis present

## 2017-05-17 DIAGNOSIS — Z833 Family history of diabetes mellitus: Secondary | ICD-10-CM

## 2017-05-17 DIAGNOSIS — G4730 Sleep apnea, unspecified: Secondary | ICD-10-CM | POA: Diagnosis present

## 2017-05-17 LAB — CBC WITH DIFFERENTIAL/PLATELET
Basophils Absolute: 0.1 10*3/uL (ref 0–0.1)
Basophils Relative: 1 %
EOS PCT: 1 %
Eosinophils Absolute: 0 10*3/uL (ref 0–0.7)
HCT: 31.2 % — ABNORMAL LOW (ref 35.0–47.0)
Hemoglobin: 10 g/dL — ABNORMAL LOW (ref 12.0–16.0)
LYMPHS ABS: 1.1 10*3/uL (ref 1.0–3.6)
LYMPHS PCT: 16 %
MCH: 26 pg (ref 26.0–34.0)
MCHC: 32.1 g/dL (ref 32.0–36.0)
MCV: 81 fL (ref 80.0–100.0)
MONO ABS: 0.6 10*3/uL (ref 0.2–0.9)
MONOS PCT: 9 %
Neutro Abs: 5.1 10*3/uL (ref 1.4–6.5)
Neutrophils Relative %: 73 %
PLATELETS: 219 10*3/uL (ref 150–440)
RBC: 3.85 MIL/uL (ref 3.80–5.20)
RDW: 15.5 % — AB (ref 11.5–14.5)
WBC: 6.9 10*3/uL (ref 3.6–11.0)

## 2017-05-17 LAB — TROPONIN I: Troponin I: 0.03 ng/mL (ref <0.03)

## 2017-05-17 LAB — BASIC METABOLIC PANEL
Anion gap: 10 (ref 5–15)
CALCIUM: 8.3 mg/dL — AB (ref 8.9–10.3)
CO2: 20 mmol/L — AB (ref 22–32)
Chloride: 101 mmol/L (ref 101–111)
Creatinine, Ser: 0.78 mg/dL (ref 0.44–1.00)
GFR calc Af Amer: >60 mL/min (ref >60)
GLUCOSE: 155 mg/dL — AB (ref 65–99)
Potassium: 3.6 mmol/L (ref 3.5–5.1)
Sodium: 131 mmol/L — ABNORMAL LOW (ref 135–145)

## 2017-05-17 LAB — URINE CULTURE: Culture: >=100000 — AB

## 2017-05-17 LAB — GLUCOSE, CAPILLARY
Glucose-Capillary: 149 mg/dL — ABNORMAL HIGH (ref 65–99)
Glucose-Capillary: 164 mg/dL — ABNORMAL HIGH (ref 65–99)
Glucose-Capillary: 146 mg/dL — ABNORMAL HIGH (ref 65–99)

## 2017-05-17 LAB — BRAIN NATRIURETIC PEPTIDE: B Natriuretic Peptide: 188 pg/mL — ABNORMAL HIGH (ref 0.0–100.0)

## 2017-05-17 LAB — LACTIC ACID, PLASMA: Lactic Acid, Venous: 1.4 mmol/L (ref 0.5–1.9)

## 2017-05-17 MED ORDER — INSULIN ASPART 100 UNIT/ML ~~LOC~~ SOLN
0.0000 [IU] | Freq: Three times a day (TID) | SUBCUTANEOUS | Status: DC
  Administered 2017-05-17: 3 [IU] via SUBCUTANEOUS
  Administered 2017-05-18: 4 [IU] via SUBCUTANEOUS
  Filled 2017-05-17 (×2): qty 1

## 2017-05-17 MED ORDER — FUROSEMIDE 10 MG/ML IJ SOLN
40.0000 mg | Freq: Once | INTRAMUSCULAR | Status: AC
  Administered 2017-05-17: 40 mg via INTRAVENOUS
  Filled 2017-05-17: qty 4

## 2017-05-17 MED ORDER — MORPHINE SULFATE (PF) 4 MG/ML IV SOLN
4.0000 mg | Freq: Once | INTRAVENOUS | Status: AC
  Administered 2017-05-17: 4 mg via INTRAVENOUS

## 2017-05-17 MED ORDER — INSULIN ASPART 100 UNIT/ML ~~LOC~~ SOLN: 0.0000 [IU] | Freq: Every day | SUBCUTANEOUS | Status: DC

## 2017-05-17 MED ORDER — ONDANSETRON HCL 4 MG/2ML IJ SOLN
4.0000 mg | Freq: Once | INTRAMUSCULAR | Status: AC
  Administered 2017-05-17: 4 mg via INTRAVENOUS
  Filled 2017-05-17: qty 2

## 2017-05-17 MED ORDER — ONDANSETRON HCL 4 MG/2ML IJ SOLN
4.0000 mg | Freq: Four times a day (QID) | INTRAMUSCULAR | Status: DC | PRN
  Administered 2017-05-17: 4 mg via INTRAVENOUS
  Filled 2017-05-17: qty 2

## 2017-05-17 MED ORDER — METOPROLOL TARTRATE 5 MG/5ML IV SOLN
5.0000 mg | Freq: Four times a day (QID) | INTRAVENOUS | Status: DC | PRN
  Administered 2017-05-18: 5 mg via INTRAVENOUS
  Filled 2017-05-17 (×2): qty 5

## 2017-05-17 MED ORDER — ACETAMINOPHEN 650 MG RE SUPP: 650.0000 mg | Freq: Four times a day (QID) | RECTAL | Status: DC | PRN

## 2017-05-17 MED ORDER — AMOXICILLIN-POT CLAVULANATE 875-125 MG PO TABS
1.0000 | ORAL_TABLET | Freq: Two times a day (BID) | ORAL | Status: DC
  Administered 2017-05-17 – 2017-05-18 (×3): 1 via ORAL
  Filled 2017-05-17 (×3): qty 1

## 2017-05-17 MED ORDER — ONDANSETRON HCL 4 MG PO TABS: 4.0000 mg | ORAL_TABLET | Freq: Four times a day (QID) | ORAL | Status: DC | PRN

## 2017-05-17 MED ORDER — CARVEDILOL 6.25 MG PO TABS
6.2500 mg | ORAL_TABLET | Freq: Two times a day (BID) | ORAL | Status: DC
  Administered 2017-05-17 – 2017-05-18 (×2): 6.25 mg via ORAL
  Filled 2017-05-17 (×2): qty 1

## 2017-05-17 MED ORDER — ASPIRIN EC 81 MG PO TBEC
81.0000 mg | DELAYED_RELEASE_TABLET | Freq: Every day | ORAL | Status: DC
  Administered 2017-05-17 – 2017-05-18 (×2): 81 mg via ORAL
  Filled 2017-05-17 (×2): qty 1

## 2017-05-17 MED ORDER — MORPHINE SULFATE (PF) 4 MG/ML IV SOLN
INTRAVENOUS | Status: AC
  Administered 2017-05-17: 4 mg via INTRAVENOUS
  Filled 2017-05-17: qty 1

## 2017-05-17 MED ORDER — GLIMEPIRIDE 1 MG PO TABS
1.0000 mg | ORAL_TABLET | Freq: Every day | ORAL | Status: DC
  Administered 2017-05-18: 1 mg via ORAL
  Filled 2017-05-17: qty 1

## 2017-05-17 MED ORDER — BISACODYL 10 MG RE SUPP: 10.0000 mg | Freq: Every day | RECTAL | Status: DC | PRN

## 2017-05-17 MED ORDER — DOCUSATE SODIUM 100 MG PO CAPS
100.0000 mg | ORAL_CAPSULE | Freq: Two times a day (BID) | ORAL | Status: DC
  Administered 2017-05-17 – 2017-05-18 (×3): 100 mg via ORAL
  Filled 2017-05-17 (×3): qty 1

## 2017-05-17 MED ORDER — SODIUM CHLORIDE 0.9% FLUSH: 3.0000 mL | INTRAVENOUS | Status: DC | PRN

## 2017-05-17 MED ORDER — SODIUM CHLORIDE 0.9% FLUSH
3.0000 mL | Freq: Two times a day (BID) | INTRAVENOUS | Status: DC
  Administered 2017-05-17 – 2017-05-18 (×3): 3 mL via INTRAVENOUS

## 2017-05-17 MED ORDER — FUROSEMIDE 10 MG/ML IJ SOLN
40.0000 mg | Freq: Two times a day (BID) | INTRAMUSCULAR | Status: DC
  Administered 2017-05-17 – 2017-05-18 (×2): 40 mg via INTRAVENOUS
  Filled 2017-05-17 (×2): qty 4

## 2017-05-17 MED ORDER — TOPIRAMATE 25 MG PO TABS
50.0000 mg | ORAL_TABLET | Freq: Two times a day (BID) | ORAL | Status: DC | PRN
  Administered 2017-05-17: 50 mg via ORAL
  Filled 2017-05-17: qty 2

## 2017-05-17 MED ORDER — POLYETHYLENE GLYCOL 3350 17 G PO PACK: 17.0000 g | PACK | Freq: Every day | ORAL | Status: DC | PRN

## 2017-05-17 MED ORDER — METFORMIN HCL 500 MG PO TABS
500.0000 mg | ORAL_TABLET | Freq: Two times a day (BID) | ORAL | Status: DC
  Administered 2017-05-17 – 2017-05-18 (×2): 500 mg via ORAL
  Filled 2017-05-17 (×2): qty 1

## 2017-05-17 MED ORDER — ACETAMINOPHEN 325 MG PO TABS
650.0000 mg | ORAL_TABLET | Freq: Four times a day (QID) | ORAL | Status: DC | PRN
  Administered 2017-05-18: 650 mg via ORAL
  Filled 2017-05-17 (×2): qty 2

## 2017-05-17 MED ORDER — IOPAMIDOL (ISOVUE-370) INJECTION 76%
100.0000 mL | Freq: Once | INTRAVENOUS | Status: AC | PRN
  Administered 2017-05-17: 100 mL via INTRAVENOUS

## 2017-05-17 MED ORDER — SODIUM CHLORIDE 0.9 % IV SOLN: 250.0000 mL | INTRAVENOUS | Status: DC | PRN

## 2017-05-17 MED ORDER — ENOXAPARIN SODIUM 40 MG/0.4ML ~~LOC~~ SOLN
40.0000 mg | Freq: Two times a day (BID) | SUBCUTANEOUS | Status: DC
  Administered 2017-05-17 – 2017-05-18 (×2): 40 mg via SUBCUTANEOUS
  Filled 2017-05-17 (×2): qty 0.4

## 2017-05-17 MED ORDER — HYDROCODONE-ACETAMINOPHEN 5-325 MG PO TABS
1.0000 | ORAL_TABLET | ORAL | Status: DC | PRN
  Administered 2017-05-17: 2 via ORAL
  Filled 2017-05-17: qty 2

## 2017-05-17 MED ORDER — NICOTINE 14 MG/24HR TD PT24
14.0000 mg | MEDICATED_PATCH | Freq: Every day | TRANSDERMAL | Status: DC
  Administered 2017-05-17: 14 mg via TRANSDERMAL
  Filled 2017-05-17 (×2): qty 1

## 2017-05-17 NOTE — Discharge Summary (Signed)
[place] [facility] [place] NAME: [name] Wolf    MR#:  [id number]  DATE OF BIRTH:  01/21/1986  DATE OF ADMISSION:  05/14/2017 ADMITTING PHYSICIAN: Saundra Shelling, MD  DATE OF DISCHARGE: 05/16/2017  2:10 PM  PRIMARY CARE PHYSICIAN: System, Pcp Not In    ADMISSION DIAGNOSIS:  Dehydration [E86.0] Tachycardia [R00.0] Hyperglycemia [R73.9] Urinary tract infection without hematuria, site unspecified [N39.0]  DISCHARGE DIAGNOSIS:  Active Problems:   Sepsis ([place])   SECONDARY DIAGNOSIS:   Past Medical History:  Diagnosis Date  . Gestational diabetes   . Hx of gallstones 31ys old   LAP removal of stones  . Obesity, Class III, BMI 40-49.9 (morbid obesity) ([place])   . Pre-diabetes     HOSPITAL COURSE:   32 year old female with past medical history of obesity, history of gallstones, gestational diabetes/prediabetes who presented to the hospital due to nausea vomiting and diarrhea, fever and suspected to have sepsis secondary to UTI.  1.  Sepsis-patient met criteria on admission given her fever, tachycardia, leukocytosis and abnormal urinalysis. -Patient was treated empirically with IV ceftriaxone and also IV Zosyn.  Patient's urinalysis was positive as mentioned in her urine cultures positive for E. coli.  After getting some IV fluids and IV antibiotics patient has been afebrile stable and her leukocytosis is improved.  She is now for being discharged on oral Augmentin.  2.  Urinary tract infection-this was the source of patient's sepsis.   -Initially patient was treated with IV ceftriaxone then changed to IV Zosyn, her urine cultures were positive for E. coli which was fairly sensitive and therefore she is being discharged on oral Augmentin. - Patient's CT scan did not show any evidence of nephrolithiasis.  3.  Uncontrolled diabetes with hyperglycemia- she had prediabetes prior to coming to the hospital but while in the hospital patient's blood  sugars were significantly uncontrolled.  Her hemoglobin A1c was noted to be as high as 8.3. - Patient was started on some low-dose Lantus and sliding scale insulin.  I diabetes coordinator consult was obtained.  At present patient is being discharged on oral metformin, Amaryl and further titrations to her diabetes meds can be done as an outpatient.    4.  Leukocytosis-secondary to sepsis/UTI.   -This improved with IV antibiotic therapy.   DISCHARGE CONDITIONS:   Stable  CONSULTS OBTAINED:    DRUG ALLERGIES:  No Known Allergies  DISCHARGE MEDICATIONS:   Allergies as of 05/16/2017   No Known Allergies     Medication List    STOP taking these medications   cyclobenzaprine 5 MG tablet Commonly known as:  FLEXERIL   ibuprofen 800 MG tablet Commonly known as:  ADVIL,MOTRIN     TAKE these medications   amoxicillin-clavulanate 875-125 MG tablet Commonly known as:  AUGMENTIN Take 1 tablet by mouth 2 (two) times daily for 7 days.   blood glucose meter kit and supplies Kit Dispense based on patient and insurance preference. Use up to four times daily as directed. (FOR ICD-9 250.00, 250.01).   glimepiride 1 MG tablet Commonly known as:  AMARYL Take 1 tablet (1 mg total) by mouth daily with breakfast.   metFORMIN 500 MG tablet Commonly known as:  GLUCOPHAGE Take 1 tablet (500 mg total) by mouth 2 (two) times daily with a meal.         DISCHARGE INSTRUCTIONS:   DIET:  Cardiac diet and Diabetic diet  DISCHARGE CONDITION:  Stable  ACTIVITY:  Activity as tolerated  OXYGEN:  Home Oxygen: No.   Oxygen Delivery: room air  DISCHARGE LOCATION:  home   If you experience worsening of your admission symptoms, develop shortness of breath, life threatening emergency, suicidal or homicidal thoughts you must seek medical attention immediately by calling 911 or calling your MD immediately  if symptoms less severe.  You Must read complete instructions/literature along with  all the possible adverse reactions/side effects for all the Medicines you take and that have been prescribed to you. Take any new Medicines after you have completely understood and accpet all the possible adverse reactions/side effects.   Please note  You were cared for by a hospitalist during your hospital stay. If you have any questions about your discharge medications or the care you received while you were in the hospital after you are discharged, you can call the unit and asked to speak with the hospitalist on call if the hospitalist that took care of you is not available. Once you are discharged, your primary care physician will handle any further medical issues. Please note that NO REFILLS for any discharge medications will be authorized once you are discharged, as it is imperative that you return to your primary care physician (or establish a relationship with a primary care physician if you do not have one) for your aftercare needs so that they can reassess your need for medications and monitor your lab values.     Today   No fever, BC remain (-).  Pt. Denies any abdominal Pain, N/V.    VITAL SIGNS:  Blood pressure 116/69, pulse (!) 107, temperature 98 F (36.7 C), temperature source Oral, resp. rate (!) 24, height 5' 3"  (1.6 m), weight 126.3 kg (278 lb 7.1 oz), last menstrual period 04/25/2017, SpO2 94 %, unknown if currently breastfeeding.  I/O:  No intake or output data in the 24 hours ending 05/17/17 1622  PHYSICAL EXAMINATION:   GENERAL:  32 y.o.-year-old obese patient lying in bed in no acute distress.  EYES: Pupils equal, round, reactive to light and accommodation. No scleral icterus. Extraocular muscles intact.  HEENT: Head atraumatic, normocephalic. Oropharynx and nasopharynx clear.  NECK:  Supple, no jugular venous distention. No thyroid enlargement, no tenderness.  LUNGS: Normal breath sounds bilaterally, no wheezing, rales, rhonchi. No use of accessory muscles of  respiration.  CARDIOVASCULAR: S1, S2 normal. No murmurs, rubs, or gallops.  ABDOMEN: Soft, nontender, nondistended. Bowel sounds present. No organomegaly or mass.  EXTREMITIES: No cyanosis, clubbing or edema b/l.    NEUROLOGIC: Cranial nerves II through XII are intact. No focal Motor or sensory deficits b/l.  Globally weak.  PSYCHIATRIC: The patient is alert and oriented x 3.  SKIN: No obvious rash, lesion, or ulcer.   DATA REVIEW:   CBC Recent Labs  Lab 05/17/17 0913  WBC 6.9  HGB 10.0*  HCT 31.2*  PLT 219    Chemistries  Recent Labs  Lab 05/14/17 1553  05/17/17 0913  NA 128*   < > 131*  K 4.0   < > 3.6  CL 96*   < > 101  CO2 19*   < > 20*  GLUCOSE 342*   < > 155*  BUN 13   < > <5*  CREATININE 1.24*   < > 0.78  CALCIUM 9.1   < > 8.3*  MG 1.3*  --   --   AST 28  --   --   ALT 16  --   --   ALKPHOS 101  --   --  BILITOT 1.1  --   --    < > = values in this interval not displayed.    Cardiac Enzymes Recent Labs  Lab 05/17/17 0913  TROPONINI 0.03*    Microbiology Results  Results for orders placed or performed during the hospital encounter of 05/14/17  Urine culture     Status: Abnormal   Collection Time: 05/14/17  6:12 PM  Result Value Ref Range Status   Specimen Description   Final    URINE, RANDOM Performed at [facility], [address] [place], [place] [address]    Special Requests   Final    NONE Performed at [facility], [address] [place], [place] [address]    Culture >=100,000 COLONIES/mL ESCHERICHIA COLI (A)  Final   Report Status 05/17/2017 FINAL  Final   Organism ID, Bacteria ESCHERICHIA COLI (A)  Final      Susceptibility   Escherichia coli - MIC*    AMPICILLIN <=2 SENSITIVE Sensitive     CEFAZOLIN <=4 SENSITIVE Sensitive     CEFTRIAXONE <=1 SENSITIVE Sensitive     CIPROFLOXACIN >=4 RESISTANT Resistant     GENTAMICIN <=1 SENSITIVE Sensitive     IMIPENEM <=0.25 SENSITIVE Sensitive     NITROFURANTOIN <=16  SENSITIVE Sensitive     TRIMETH/SULFA <=20 SENSITIVE Sensitive     AMPICILLIN/SULBACTAM <=2 SENSITIVE Sensitive     PIP/TAZO <=4 SENSITIVE Sensitive     Extended ESBL NEGATIVE Sensitive     * >=100,000 COLONIES/mL ESCHERICHIA COLI  Culture, blood (routine x 2)     Status: None (Preliminary result)   Collection Time: 05/14/17 10:54 PM  Result Value Ref Range Status   Specimen Description BLOOD LEFT HAND  Final   Special Requests   Final    BOTTLES DRAWN AEROBIC AND ANAEROBIC Blood Culture adequate volume   Culture   Final    NO GROWTH 3 DAYS Performed at [facility], [address], [place], [place] [address]    Report Status PENDING  Incomplete  Culture, blood (routine x 2)     Status: None (Preliminary result)   Collection Time: 05/14/17 11:00 PM  Result Value Ref Range Status   Specimen Description BLOOD RIGHT HAND  Final   Special Requests   Final    BOTTLES DRAWN AEROBIC AND ANAEROBIC Blood Culture adequate volume   Culture   Final    NO GROWTH 3 DAYS Performed at [facility], [address] [place], [place] [address]    Report Status PENDING  Incomplete    RADIOLOGY:  Ct Angio Chest Pe W And/or Wo Contrast  Result Date: 05/17/2017 CLINICAL DATA:  Worsening shortness of breath. EXAM: CT ANGIOGRAPHY CHEST WITH CONTRAST TECHNIQUE: Multidetector CT imaging of the chest was performed using the standard protocol during bolus administration of intravenous contrast. Multiplanar CT image reconstructions and MIPs were obtained to evaluate the vascular anatomy. CONTRAST:  19m ISOVUE-370 IOPAMIDOL (ISOVUE-370) INJECTION 76% COMPARISON:  None. FINDINGS: Cardiovascular: The heart size is normal. No pericardial effusion. No thoracic aortic aneurysm. No filling defect within the opacified pulmonary arteries to suggest acute pulmonary embolus. Mediastinum/Nodes: 13 mm short axis high right paratracheal lymph node is seen image 23/series 5. No other mediastinal  lymphadenopathy evident. No hilar lymphadenopathy. The esophagus has normal imaging features. There is no axillary lymphadenopathy. Lungs/Pleura: Patchy and nodular airspace disease is seen in the upper lungs with more confluent collapse/consolidation in the dependent lower lobes bilaterally. Small right and tiny left pleural effusions  evident. Upper Abdomen: Unremarkable. Musculoskeletal: Bone windows reveal no worrisome lytic or sclerotic osseous lesions. Review of the MIP images confirms the above findings. IMPRESSION: 1. No CT evidence for acute pulmonary embolus. 2. Patchy and nodular airspace disease in both lungs with more confluent collapse/consolidation in the lower lobes bilaterally. Given a somewhat dependent distribution, pulmonary edema would be a consideration. Diffuse infection could also have this appearance. 3. Small bilateral pleural effusions. 4. Borderline right paratracheal lymph node may be reactive. Electronically Signed   By: Misty Stanley M.D.   On: 05/17/2017 11:20   Dg Chest Port 1 View  Result Date: 05/17/2017 CLINICAL DATA:  Shortness of Breath EXAM: PORTABLE CHEST 1 VIEW COMPARISON:  May 14, 2017 FINDINGS: There is patchy interstitial pulmonary edema. There is cardiomegaly with pulmonary venous hypertension. No appreciable consolidation. No adenopathy. No bone lesions. IMPRESSION: Pulmonary vascular congestion with a degree of interstitial pulmonary edema. Suspect a degree of congestive heart failure. No consolidation. Electronically Signed   By: Lowella Grip III M.D.   On: 05/17/2017 09:42      Management plans discussed with the patient, family and they are in agreement.  CODE STATUS:  Code Status History    Date Active Date Inactive Code Status Order ID Comments User Context   05/14/2017 2213 05/16/2017 1717 Full Code [id number]  Saundra Shelling, MD Inpatient    TOTAL TIME TAKING CARE OF THIS PATIENT: 40 minutes.    Henreitta Leber M.D on 05/17/2017 at 4:22  PM  Between 7am to 6pm - Pager - [phone]  After 6pm go to [email or web address] - Proofreader  [facility] [place] [facility]  Office  [phone]  CC: Primary care physician; System, Pcp Not In

## 2017-05-17 NOTE — ED Notes (Signed)
Patient's family sitting at bedside with patient. Patient's mother questioning this RN about test results and patient diagnosis. Verbal permission to speak with mother given by patient. This RN informed mother that we were waiting on test results to come back, but at this moment, the MD was treating for excess fluid with IV diuretics. Patient's mother proceeded to make comments to this RN about daughter's recent discharge from hospital and "how could you send her home for this to happen. [facility]." This RN apologized for patient's bad experience and stated that the floor has different doctors and nurses than the ED and that we had no control over decisions made outside of the ED. Dr. Mayford KnifeWilliams into room to round on patient and able to sit down and speak with mother. Dr. Mayford KnifeWilliams stated to mother that [facility] if that was the family's decision. Mother states "If I knew who I could sue for this, I would." Dr. Mayford KnifeWilliams apologized again for patient's recent experience and explained current plan of care. Patient and mother agreeable to stay for treatment at this time.

## 2017-05-17 NOTE — H&P (Addendum)
[place] [facility] [place] NAME: [name] Wolf    MR#:  [id number]  DATE OF BIRTH:  [birth year]  DATE OF ADMISSION:  05/17/2017  PRIMARY CARE PHYSICIAN: System, Pcp Not In   REQUESTING/REFERRING PHYSICIAN:   CHIEF COMPLAINT:   Chief Complaint  Patient presents with  . Respiratory Distress    HISTORY OF PRESENT ILLNESS: [name] Wolf  is a 32 y.o. female with a known history per below, discharged from the hospital on yesterday for UTI/sepsis, returns today with acute shortness of breath, found to be in acute respiratory distress per ED attending, requiring nonrebreather, noted tachycardia, hypertension, O2 saturation in the 60s, chest x-ray noted for edema, CT chest noted for edema, BNP 188, sodium 131, EKG was sinus tachycardia, patient evaluated emergency room, no apparent distress, resting comfortably in bed with O2 via nasal cannula, family at the bedside, patient now being admitted for acute newly diagnosed congestive heart failure exacerbation  PAST MEDICAL HISTORY:   Past Medical History:  Diagnosis Date  . Gestational diabetes   . Hx of gallstones 19ys old   LAP removal of stones  . Obesity, Class III, BMI 40-49.9 (morbid obesity) ([place])   . Pre-diabetes     PAST SURGICAL HISTORY:  Past Surgical History:  Procedure Laterality Date  . LAPAROSCOPY  32 yo   gallstones removed    SOCIAL HISTORY:  Social History   Tobacco Use  . Smoking status: Current Every Day Smoker    Packs/day: 1.00    Types: Cigarettes  . Smokeless tobacco: Never Used  Substance Use Topics  . Alcohol use: No    Alcohol/week: 0.0 oz    FAMILY HISTORY:  Family History  Problem Relation Age of Onset  . Hypertension Mother   . Diabetes Sister   . Migraines Sister     DRUG ALLERGIES: NKDA  REVIEW OF SYSTEMS:   CONSTITUTIONAL: No fever, fatigue or weakness.  EYES: No blurred or double vision.  EARS, NOSE, AND THROAT: No tinnitus or ear pain.   RESPIRATORY: +cough, shortness of breath, no wheezing or hemoptysis.  CARDIOVASCULAR: No chest pain, orthopnea, edema.  GASTROINTESTINAL: No nausea, vomiting, diarrhea or abdominal pain.  GENITOURINARY: No dysuria, hematuria.  ENDOCRINE: No polyuria, nocturia,  HEMATOLOGY: No anemia, easy bruising or bleeding SKIN: No rash or lesion. MUSCULOSKELETAL: No joint pain or arthritis.   NEUROLOGIC: No tingling, numbness, weakness.  PSYCHIATRY: No anxiety or depression.   MEDICATIONS AT HOME:  Prior to Admission medications   Medication Sig Start Date End Date Taking? Authorizing Provider  amoxicillin-clavulanate (AUGMENTIN) 875-125 MG tablet Take 1 tablet by mouth 2 (two) times daily for 7 days. 05/16/17 05/23/17 Yes Sainani, Belia Heman, MD  blood glucose meter kit and supplies KIT Dispense based on patient and insurance preference. Use up to four times daily as directed. (FOR ICD-9 250.00, 250.01). 05/16/17  Yes Sainani, Belia Heman, MD  glimepiride (AMARYL) 1 MG tablet Take 1 tablet (1 mg total) by mouth daily with breakfast. Patient not taking: Reported on 05/17/2017 05/16/17 07/15/17  Henreitta Leber, MD  metFORMIN (GLUCOPHAGE) 500 MG tablet Take 1 tablet (500 mg total) by mouth 2 (two) times daily with a meal. Patient not taking: Reported on 05/17/2017 05/16/17 07/15/17  Henreitta Leber, MD      PHYSICAL EXAMINATION:   VITAL SIGNS: Blood pressure (!) 152/88, pulse (!) 123, temperature 98.7 F (37.1 C), temperature source Oral, resp. rate (!) 26, height 5' 3"  (1.6 m), weight 126.1  kg (278 lb), last menstrual period 04/25/2017, SpO2 93 %, unknown if currently breastfeeding.  GENERAL:  32 y.o.-year-old patient lying in the bed with no acute distress.  Extreme morbid obesity diminished breath sounds throughout EYES: Pupils equal, round, reactive to light and accommodation. No scleral icterus. Extraocular muscles intact.  HEENT: Head atraumatic, normocephalic. Oropharynx and nasopharynx clear.  NECK:   Supple, no jugular venous distention. No thyroid enlargement, no tenderness.  LUNGS: Diminished breath sounds throughout. No use of accessory muscles of respiration.  CARDIOVASCULAR: S1, S2 normal. No murmurs, rubs, or gallops.  ABDOMEN: Soft, nontender, nondistended. Bowel sounds present. No organomegaly or mass.  EXTREMITIES: No pedal edema, cyanosis, or clubbing.  NEUROLOGIC: Cranial nerves II through XII are intact. Muscle strength 5/5 in all extremities. Sensation intact. Gait not checked.  PSYCHIATRIC: The patient is alert and oriented x 3.  SKIN: left nickle sized honey-crusted, non-painful, left facial skin lesion   LABORATORY PANEL:   CBC Recent Labs  Lab 05/14/17 1553 05/15/17 0340 05/16/17 0725 05/17/17 0913  WBC 22.9* 17.4* 8.8 6.9  HGB 13.4 10.8* 9.2* 10.0*  HCT 42.9 34.7* 29.3* 31.2*  PLT 308 207 171 219  MCV 81.2 82.1 81.4 81.0  MCH 25.5* 25.7* 25.6* 26.0  MCHC 31.3* 31.3* 31.4* 32.1  RDW 15.5* 15.0* 15.1* 15.5*  LYMPHSABS  --   --   --  1.1  MONOABS  --   --   --  0.6  EOSABS  --   --   --  0.0  BASOSABS  --   --   --  0.1   ------------------------------------------------------------------------------------------------------------------  Chemistries  Recent Labs  Lab 05/14/17 1553 05/15/17 0340 05/16/17 0725 05/17/17 0913  NA 128* 133* 132* 131*  K 4.0 3.7 3.6 3.6  CL 96* 105 106 101  CO2 19* 20* 20* 20*  GLUCOSE 342* 301* 183* 155*  BUN 13 10 9  <5*  CREATININE 1.24* 0.97 0.98 0.78  CALCIUM 9.1 7.8* 7.7* 8.3*  MG 1.3*  --   --   --   AST 28  --   --   --   ALT 16  --   --   --   ALKPHOS 101  --   --   --   BILITOT 1.1  --   --   --    ------------------------------------------------------------------------------------------------------------------ estimated creatinine clearance is 132.9 mL/min (by C-G formula based on SCr of 0.78  mg/dL). ------------------------------------------------------------------------------------------------------------------ Recent Labs    05/14/17 1553  TSH 0.781     Coagulation profile No results for input(s): INR, PROTIME in the last 168 hours. ------------------------------------------------------------------------------------------------------------------- No results for input(s): DDIMER in the last 72 hours. -------------------------------------------------------------------------------------------------------------------  Cardiac Enzymes Recent Labs  Lab 05/14/17 1553 05/14/17 2128 05/17/17 0913  TROPONINI <0.03 <0.03 0.03*   ------------------------------------------------------------------------------------------------------------------ Invalid input(s): POCBNP  ---------------------------------------------------------------------------------------------------------------  Urinalysis    Component Value Date/Time   COLORURINE AMBER (A) 05/14/2017 1812   APPEARANCEUR CLOUDY (A) 05/14/2017 1812   APPEARANCEUR Cloudy (A) 03/06/2015 1141   LABSPEC 1.026 05/14/2017 1812   PHURINE 5.0 05/14/2017 1812   GLUCOSEU >=500 (A) 05/14/2017 1812   HGBUR MODERATE (A) 05/14/2017 1812   BILIRUBINUR NEGATIVE 05/14/2017 1812   BILIRUBINUR negative 10/06/2015 1537   BILIRUBINUR Negative 03/06/2015 1141   KETONESUR 20 (A) 05/14/2017 1812   PROTEINUR 100 (A) 05/14/2017 1812   UROBILINOGEN negative 10/06/2015 1537   NITRITE POSITIVE (A) 05/14/2017 1812   LEUKOCYTESUR TRACE (A) 05/14/2017 1812   LEUKOCYTESUR Negative 03/06/2015 1141  RADIOLOGY: Ct Angio Chest Pe W And/or Wo Contrast  Result Date: 05/17/2017 CLINICAL DATA:  Worsening shortness of breath. EXAM: CT ANGIOGRAPHY CHEST WITH CONTRAST TECHNIQUE: Multidetector CT imaging of the chest was performed using the standard protocol during bolus administration of intravenous contrast. Multiplanar CT image reconstructions and  MIPs were obtained to evaluate the vascular anatomy. CONTRAST:  166m ISOVUE-370 IOPAMIDOL (ISOVUE-370) INJECTION 76% COMPARISON:  None. FINDINGS: Cardiovascular: The heart size is normal. No pericardial effusion. No thoracic aortic aneurysm. No filling defect within the opacified pulmonary arteries to suggest acute pulmonary embolus. Mediastinum/Nodes: 13 mm short axis high right paratracheal lymph node is seen image 23/series 5. No other mediastinal lymphadenopathy evident. No hilar lymphadenopathy. The esophagus has normal imaging features. There is no axillary lymphadenopathy. Lungs/Pleura: Patchy and nodular airspace disease is seen in the upper lungs with more confluent collapse/consolidation in the dependent lower lobes bilaterally. Small right and tiny left pleural effusions evident. Upper Abdomen: Unremarkable. Musculoskeletal: Bone windows reveal no worrisome lytic or sclerotic osseous lesions. Review of the MIP images confirms the above findings. IMPRESSION: 1. No CT evidence for acute pulmonary embolus. 2. Patchy and nodular airspace disease in both lungs with more confluent collapse/consolidation in the lower lobes bilaterally. Given a somewhat dependent distribution, pulmonary edema would be a consideration. Diffuse infection could also have this appearance. 3. Small bilateral pleural effusions. 4. Borderline right paratracheal lymph node may be reactive. Electronically Signed   By: EMisty StanleyM.D.   On: 05/17/2017 11:20   Dg Chest Port 1 View  Result Date: 05/17/2017 CLINICAL DATA:  Shortness of Breath EXAM: PORTABLE CHEST 1 VIEW COMPARISON:  May 14, 2017 FINDINGS: There is patchy interstitial pulmonary edema. There is cardiomegaly with pulmonary venous hypertension. No appreciable consolidation. No adenopathy. No bone lesions. IMPRESSION: Pulmonary vascular congestion with a degree of interstitial pulmonary edema. Suspect a degree of congestive heart failure. No consolidation. Electronically  Signed   By: WLowella GripIII M.D.   On: 05/17/2017 09:42    EKG: Orders placed or performed during the hospital encounter of 05/17/17  . ED EKG  . ED EKG  . EKG 12-Lead  . EKG 12-Lead    IMPRESSION AND PLAN: 1 acute newly diagnosed congestive heart failure exacerbation most likely secondary to diastolic dysfunction Admit to regular nursing floor bed, IV Lasix twice daily, Coreg twice daily, aspirin, strict I&O monitoring, daily weights, fluid restriction, education while in house, rule out acute coronary syndrome with cardiac enzymes x3 sets, supplemental oxygen as needed weaning as tolerated  2 acute hypoxic respiratory failure Secondary to above Breathing treatments as needed and all the placements as stated above  3 chronic tobacco smoking abuse/dependency Nicotine patch and cessation counseling ordered  4 chronic morbid obesity Secondary to excess calories Lifestyle modification recommended  5 recently newly diagnosed diabetes mellitus type 2 Sliding scale insulin with Accu-Cheks per routine, continue Metformin, education while in house  6 acute E. coli UTI Last admission was for sepsis due to UTI Continue Augmentin for another 3 days   7 acute probable impetigo bactroban bid for 5 day course  All the records are reviewed and case discussed with ED provider. Management plans discussed with the patient, family and they are in agreement.  CODE STATUS:full Code Status History    Date Active Date Inactive Code Status Order ID Comments User Context   05/14/2017 2213 05/16/2017 1717 Full Code [id number] PSaundra Shelling MD Inpatient   10/12/2015 0121 10/13/2015 2130 Full Code [id number]  Rubie Maid, MD Inpatient   10/10/2015 2200 10/12/2015 0121 Full Code [id number]  Rubie Maid, MD Inpatient       TOTAL TIME TAKING CARE OF THIS PATIENT: 45 minutes.    Avel Peace Gio Janoski M.D on 05/17/2017   Between 7am to 6pm - Pager - [phone]  After 6pm go to [email or web address]  - password [email or web address] [facility]  Office  [phone]  CC: Primary care physician; System, Pcp Not In   Note: This dictation was prepared with Dragon dictation along with smaller phrase technology. Any transcriptional errors that result from this process are unintentional.

## 2017-05-17 NOTE — Progress Notes (Signed)
Anticoagulation monitoring(Lovenox):  32 yo female ordered Lovenox 40 mg Q24h  Filed Weights   05/17/17 0921  Weight: 278 lb (126.1 kg)   BMI 49.26   Lab Results  Component Value Date   CREATININE 0.78 05/17/2017   CREATININE 0.98 05/16/2017   CREATININE 0.97 05/15/2017   Estimated Creatinine Clearance: 132.9 mL/min (by C-G formula based on SCr of 0.78 mg/dL). Hemoglobin & Hematocrit     Component Value Date/Time   HGB 10.0 (L) 05/17/2017 0913   HGB 11.9 03/06/2015 1144   HCT 31.2 (L) 05/17/2017 0913   HCT 36.1 03/06/2015 1144     Per Protocol for Patient with estCrcl > 30 ml/min and BMI > 40, will transition to Lovenox 40 mg Q12h.

## 2017-05-17 NOTE — ED Triage Notes (Addendum)
Patient from home via [facility]. Reports she started having SOB and increased work of breathing last night. Reports this morning she woke up and couldn't get a deep breath and felt her heart racing. Per patient, she was seen and treated for UTI in hospital and discharged yesterday and started on antibiotic for infection. Patient alert and oriented x4. Speaking in 2-3 word sentences.

## 2017-05-17 NOTE — ED Notes (Signed)
Pt to floor via stretcher. VSS. NAD at this time. Report called to NechePhyllis on 2C. All questions answered.

## 2017-05-17 NOTE — Progress Notes (Signed)
Primary RN in ED, stated that pt. is not in distress at this time.

## 2017-05-17 NOTE — ED Notes (Signed)
Emptied out 800 from cath. Pt NAD. Is able to speak in complete sentences. Call light within reach

## 2017-05-17 NOTE — ED Provider Notes (Signed)
[facility] Emergency Department Provider Note       Time seen: ----------------------------------------- 9:10 AM on 05/17/2017 -----------------------------------------   I have reviewed the triage vital signs and the nursing notes.  HISTORY   Chief Complaint Respiratory Distress    HPI [name] Wolf is a 32 y.o. female with a history of gestational diabetes, cholecystectomy, obesity and recent UTI for which she was admitted who presents to the ED for increased shortness of breath and increased work of breathing since last night.  Patient reports this morning she woke up and could not get a deep breath and felt her heart racing.  She was seen and treated for UTI and discharged yesterday from the hospital after receiving fluids and antibiotics.  She states she recently has still had a fever.  She is having central dull chest pain and significant difficulty breathing.  Patient states it does hurt when she breathes.  Past Medical History:  Diagnosis Date  . Gestational diabetes   . Hx of gallstones 74ys old   LAP removal of stones  . Obesity, Class III, BMI 40-49.9 (morbid obesity) ([facility])   . Pre-diabetes     Patient Active Problem List   Diagnosis Date Noted  . Sepsis ([facility]) 05/14/2017  . Anemia of pregnancy 10/12/2015  . Family history of congenital anomalies   . Gestational diabetes mellitus (GDM) in third trimester controlled on oral hypoglycemic drug 06/03/2015  . Family or maternal historic risk of congenital anomaly 04/10/2015  . Tobacco abuse 04/10/2015  . Genetic screening 04/10/2015  . Pre-diabetes 04/10/2015  . Rubella non-immune status, antepartum 04/10/2015  . Acanthosis nigricans 05/08/2013  . Acid reflux 05/08/2013  . Snores 05/08/2013    Past Surgical History:  Procedure Laterality Date  . LAPAROSCOPY  32 yo   gallstones removed    Allergies Patient has no known allergies.  Social History Social History   Tobacco Use   . Smoking status: Current Every Day Smoker    Packs/day: 1.00    Types: Cigarettes  . Smokeless tobacco: Never Used  Substance Use Topics  . Alcohol use: No    Alcohol/week: 0.0 oz  . Drug use: No   Review of Systems Constitutional: Positive for fever Cardiovascular: Positive for chest pain Respiratory: Positive for dyspnea Gastrointestinal: Negative for abdominal pain, vomiting and diarrhea. Genitourinary: Negative for dysuria. Musculoskeletal: Negative for back pain. Skin: Negative for rash. Neurological: Negative for headaches, focal weakness or numbness.  All systems negative/normal/unremarkable except as stated in the HPI  ____________________________________________   PHYSICAL EXAM:  VITAL SIGNS: ED Triage Vitals  Enc Vitals Group     BP      Pulse      Resp      Temp      Temp src      SpO2      Weight      Height      Head Circumference      Peak Flow      Pain Score      Pain Loc      Pain Edu?      Excl. in GC?    Constitutional: Alert and oriented.  Moderate distress Eyes: Conjunctivae are normal. Normal extraocular movements. ENT   Head: Normocephalic and atraumatic.   Nose: No congestion/rhinnorhea.   Mouth/Throat: Mucous membranes are moist.   Neck: No stridor. Cardiovascular: Rapid rate, regular rhythm. No murmurs, rubs, or gallops. Respiratory: Tachypnea with splinting and diminished breath sounds bilaterally, rales Gastrointestinal: Soft  and nontender. Normal bowel sounds Musculoskeletal: Nontender with normal range of motion in extremities. No lower extremity tenderness nor edema. Neurologic:  Normal speech and language. No gross focal neurologic deficits are appreciated.  Skin:  Skin is warm, dry and intact. No rash noted. Psychiatric: Anxious mood and affect ____________________________________________  EKG: Interpreted by me.  Sinus tachycardia with a rate of 119 bpm, normal PR interval, normal QRS, normal  QT.  ____________________________________________  ED COURSE:  As part of my medical decision making, I reviewed the following data within the electronic MEDICAL RECORD NUMBER History obtained from family if available, nursing notes, old chart and ekg, as well as notes from prior ED visits. Patient presented for dyspnea, we will assess with labs and imaging as indicated at this time.   Procedures ____________________________________________   LABS (pertinent positives/negatives)  Labs Reviewed  CBC WITH DIFFERENTIAL/PLATELET - Abnormal; Notable for the following components:      Result Value   Hemoglobin 10.0 (*)    HCT 31.2 (*)    RDW 15.5 (*)    All other components within normal limits  BASIC METABOLIC PANEL - Abnormal; Notable for the following components:   Sodium 131 (*)    CO2 20 (*)    Glucose, Bld 155 (*)    BUN <5 (*)    Calcium 8.3 (*)    All other components within normal limits  BRAIN NATRIURETIC PEPTIDE - Abnormal; Notable for the following components:   B Natriuretic Peptide 188.0 (*)    All other components within normal limits  TROPONIN I - Abnormal; Notable for the following components:   Troponin I 0.03 (*)    All other components within normal limits  CULTURE, BLOOD (ROUTINE X 2)  CULTURE, BLOOD (ROUTINE X 2)  LACTIC ACID, PLASMA  BLOOD GAS, VENOUS   CRITICAL CARE Performed by: Ulice Dash   Total critical care time: 30 minutes  Critical care time was exclusive of separately billable procedures and treating other patients.  Critical care was necessary to treat or prevent imminent or life-threatening deterioration.  Critical care was time spent personally by me on the following activities: development of treatment plan with patient and/or surrogate as well as nursing, discussions with consultants, evaluation of patient's response to treatment, examination of patient, obtaining history from patient or surrogate, ordering and performing treatments  and interventions, ordering and review of laboratory studies, ordering and review of radiographic studies, pulse oximetry and re-evaluation of patient's condition.  RADIOLOGY Images were viewed by me  Chest x-ray reveals congestive pattern IMPRESSION: Pulmonary vascular congestion with a degree of interstitial pulmonary edema. Suspect a degree of congestive heart failure. No consolidation.  ____________________________________________  DIFFERENTIAL DIAGNOSIS   Volume overload, renal failure, electrolyte abnormality, pneumonia, PE, pneumothorax  FINAL ASSESSMENT AND PLAN  Respiratory distress, hypoxia, pulmonary edema   Plan: The patient had presented for acute respiratory distress and was noted to be markedly hypoxic requiring a nonrebreather. Patient's labs did reveal an elevated BNP and troponin.  CBC and chemistries are normal and lactic acid level is normal. Patient's imaging suggested congestive heart failure on chest x-ray.  Patient was started on IV Lasix and did diurese well.  However she still requires nasal cannula oxygen to maintain her oxygen saturations.  She will need to be admitted with diuresis and echocardiogram.   Ulice Dash, MD   Note: This note was generated in part or whole with voice recognition software. Voice recognition is usually quite accurate but there are  transcription errors that can and very often do occur. I apologize for any typographical errors that were not detected and corrected.     Emily FilbertWilliams, Sho Salguero E, MD 05/17/17 1136

## 2017-05-18 LAB — BASIC METABOLIC PANEL
Anion gap: 11 (ref 5–15)
BUN: 6 mg/dL (ref 6–20)
CALCIUM: 8.5 mg/dL — AB (ref 8.9–10.3)
CO2: 26 mmol/L (ref 22–32)
CREATININE: 0.72 mg/dL (ref 0.44–1.00)
Chloride: 100 mmol/L — ABNORMAL LOW (ref 101–111)
GFR calc Af Amer: >60 mL/min (ref >60)
GFR calc non Af Amer: >60 mL/min (ref >60)
GLUCOSE: 128 mg/dL — AB (ref 65–99)
Potassium: 3.3 mmol/L — ABNORMAL LOW (ref 3.5–5.1)
Sodium: 137 mmol/L (ref 135–145)

## 2017-05-18 LAB — BLOOD CULTURE ID PANEL (REFLEXED)
ACINETOBACTER BAUMANNII: NOT DETECTED
CANDIDA ALBICANS: NOT DETECTED
CANDIDA GLABRATA: NOT DETECTED
CANDIDA KRUSEI: NOT DETECTED
CANDIDA PARAPSILOSIS: NOT DETECTED
Candida tropicalis: NOT DETECTED
ENTEROBACTER CLOACAE COMPLEX: NOT DETECTED
ENTEROBACTERIACEAE SPECIES: NOT DETECTED
ENTEROCOCCUS SPECIES: NOT DETECTED
ESCHERICHIA COLI: NOT DETECTED
Haemophilus influenzae: NOT DETECTED
KLEBSIELLA OXYTOCA: NOT DETECTED
Klebsiella pneumoniae: NOT DETECTED
LISTERIA MONOCYTOGENES: NOT DETECTED
Methicillin resistance: DETECTED — AB
Neisseria meningitidis: NOT DETECTED
PSEUDOMONAS AERUGINOSA: NOT DETECTED
Proteus species: NOT DETECTED
STAPHYLOCOCCUS AUREUS BCID: NOT DETECTED
STREPTOCOCCUS PNEUMONIAE: NOT DETECTED
STREPTOCOCCUS PYOGENES: NOT DETECTED
Serratia marcescens: NOT DETECTED
Staphylococcus species: DETECTED — AB
Streptococcus agalactiae: NOT DETECTED
Streptococcus species: NOT DETECTED

## 2017-05-18 LAB — GLUCOSE, CAPILLARY
Glucose-Capillary: 119 mg/dL — ABNORMAL HIGH (ref 65–99)
Glucose-Capillary: 179 mg/dL — ABNORMAL HIGH (ref 65–99)

## 2017-05-18 LAB — URINALYSIS, COMPLETE (UACMP) WITH MICROSCOPIC
Bacteria, UA: NONE SEEN
Bilirubin Urine: NEGATIVE
GLUCOSE, UA: NEGATIVE mg/dL
Hgb urine dipstick: NEGATIVE
KETONES UR: 20 mg/dL — AB
Leukocytes, UA: NEGATIVE
Nitrite: NEGATIVE
PH: 6 (ref 5.0–8.0)
Protein, ur: NEGATIVE mg/dL
SPECIFIC GRAVITY, URINE: 1.008 (ref 1.005–1.030)

## 2017-05-18 LAB — ECHOCARDIOGRAM COMPLETE
Height: 63 in
WEIGHTICAEL: 4345.71 [oz_av]

## 2017-05-18 MED ORDER — VALACYCLOVIR HCL 500 MG PO TABS
1000.0000 mg | ORAL_TABLET | Freq: Three times a day (TID) | ORAL | Status: DC
  Administered 2017-05-18: 1000 mg via ORAL
  Filled 2017-05-18 (×3): qty 2

## 2017-05-18 MED ORDER — LEVALBUTEROL HCL 0.63 MG/3ML IN NEBU
0.6300 mg | INHALATION_SOLUTION | Freq: Four times a day (QID) | RESPIRATORY_TRACT | Status: DC | PRN
  Filled 2017-05-18: qty 3

## 2017-05-18 MED ORDER — MUPIROCIN 2 % EX OINT
TOPICAL_OINTMENT | Freq: Three times a day (TID) | CUTANEOUS | Status: DC
  Administered 2017-05-18: 13:00:00 via NASAL
  Filled 2017-05-18: qty 22

## 2017-05-18 MED ORDER — IPRATROPIUM BROMIDE 0.02 % IN SOLN: 0.5000 mg | Freq: Four times a day (QID) | RESPIRATORY_TRACT | Status: DC | PRN

## 2017-05-18 MED ORDER — VANCOMYCIN HCL 10 G IV SOLR
1250.0000 mg | Freq: Once | INTRAVENOUS | Status: DC
  Filled 2017-05-18 (×2): qty 1250

## 2017-05-18 NOTE — Progress Notes (Signed)
Pt has left AMA with mom.

## 2017-05-18 NOTE — Progress Notes (Signed)
Patient and family unhappy with current results of care, states that "we have only made things worse." Pt stated that upon transfer to the unit this morning the nurses were rude to her, stating that they did not want their baby to get shingles. [facility], MD notified, states he will not discharge patient due to that fact that it is against his medical advice. Pt says [facility] will leave [facility] and [facility].

## 2017-05-18 NOTE — Progress Notes (Signed)
Pt's mom was upset and wanted us to [facility] pt. [facility]  I explained that the patient does not have a medical reason to be [facility] as were providing appropriate care here.  Patient wants to leave AGAINST MEDICAL ADVICE and [facility].  Advised nursing staff to provide the patient or mother with AMA form to be signed before the patient leaves.

## 2017-05-18 NOTE — Progress Notes (Signed)
Pt educated about AMA leave, and given form to sign. AMA form placed in chart, pt IV removed, and telemetry monitoring discontinued, pt waiting in room for mom to pick her up.

## 2017-05-18 NOTE — Progress Notes (Signed)
PHARMACY - PHYSICIAN COMMUNICATION CRITICAL VALUE ALERT - BLOOD CULTURE IDENTIFICATION (BCID)  Shamyia Laver is an 32 y.o. femaleDarolyn Rua who [facility] with a chief complaint of AECHF  Assessment:  32 yo just discharged from hospital on 3/26 had UTI/sepsis. Patient discharged on Augmentin  Name of physician (or Provider) Contacted: Sainani  Current antibiotics: Augmentin   Changes to prescribed antibiotics recommended:  Will give 1 dose of Vancomycin 1250mg  IV per MD. Possible contaminant?  Results for orders placed or performed during the hospital encounter of 05/17/17  Blood Culture ID Panel (Reflexed) (Collected: 05/17/2017 11:27 AM)  Result Value Ref Range   Enterococcus species NOT DETECTED NOT DETECTED   Listeria monocytogenes NOT DETECTED NOT DETECTED   Staphylococcus species DETECTED (A) NOT DETECTED   Staphylococcus aureus NOT DETECTED NOT DETECTED   Methicillin resistance DETECTED (A) NOT DETECTED   Streptococcus species NOT DETECTED NOT DETECTED   Streptococcus agalactiae NOT DETECTED NOT DETECTED   Streptococcus pneumoniae NOT DETECTED NOT DETECTED   Streptococcus pyogenes NOT DETECTED NOT DETECTED   Acinetobacter baumannii NOT DETECTED NOT DETECTED   Enterobacteriaceae species NOT DETECTED NOT DETECTED   Enterobacter cloacae complex NOT DETECTED NOT DETECTED   Escherichia coli NOT DETECTED NOT DETECTED   Klebsiella oxytoca NOT DETECTED NOT DETECTED   Klebsiella pneumoniae NOT DETECTED NOT DETECTED   Proteus species NOT DETECTED NOT DETECTED   Serratia marcescens NOT DETECTED NOT DETECTED   Haemophilus influenzae NOT DETECTED NOT DETECTED   Neisseria meningitidis NOT DETECTED NOT DETECTED   Pseudomonas aeruginosa NOT DETECTED NOT DETECTED   Candida albicans NOT DETECTED NOT DETECTED   Candida glabrata NOT DETECTED NOT DETECTED   Candida krusei NOT DETECTED NOT DETECTED   Candida parapsilosis NOT DETECTED NOT DETECTED   Candida tropicalis NOT  DETECTED NOT DETECTED    Arthurine Oleary A 05/18/2017  10:56 AM

## 2017-05-18 NOTE — Progress Notes (Signed)
Report called to britney (2A). Pt stated she wants to let her family know she changed rooms later this am.

## 2017-05-18 NOTE — Progress Notes (Signed)
Pt refusing bipap after just a few minutes stating "this is just too much". Pt wants O2 increased slightly and states she will be "fine with that". Will continue to monitor.

## 2017-05-18 NOTE — Progress Notes (Signed)
MD notified of pt c/o SOB. O2 sats WDL on 2L O2 [place] - lung sounds diminished with no crackles/congestion per RT. Orders for Bipap for bedtime and PRN breathing treatments. Will continue to monitor.

## 2017-05-18 NOTE — Progress Notes (Signed)
[employer] - [place] at [facility]   PATIENT NAME: [name] RuaDarmesha Broom    MR#:  [id number]  DATE OF BIRTH:  [birth year]  SUBJECTIVE:    Pt. presented to the hospital after being recently discharged due to shortness of breath and respiratory distress and noted to be in CHF.  Currently being diuresed with IV Lasix and improving.  REVIEW OF SYSTEMS:    Review of Systems  Constitutional: Negative for chills and fever.  HENT: Negative for congestion and tinnitus.   Eyes: Negative for blurred vision and double vision.  Respiratory: Positive for cough and shortness of breath. Negative for wheezing.   Cardiovascular: Negative for chest pain, orthopnea and PND.  Gastrointestinal: Negative for abdominal pain, diarrhea, nausea and vomiting.  Genitourinary: Negative for dysuria and hematuria.  Neurological: Negative for dizziness, sensory change and focal weakness.  All other systems reviewed and are negative.   Nutrition: Heart Healthy/Carb modified Tolerating Diet: Yes Tolerating PT: Ambulatory  DRUG ALLERGIES:  No Known Allergies  VITALS:  Blood pressure 139/87, pulse (!) 102, temperature 99.5 F (37.5 C), temperature source Oral, resp. rate 14, height 5\' 3"  (1.6 m), weight 116.9 kg (257 lb 11.2 oz), last menstrual period 04/25/2017, SpO2 95 %, unknown if currently breastfeeding.  PHYSICAL EXAMINATION:   Physical Exam  GENERAL:  32 y.o.-year-old obese patient lying in bed in no acute distress.  EYES: Pupils equal, round, reactive to light and accommodation. No scleral icterus. Extraocular muscles intact.  HEENT: Head atraumatic, normocephalic. Oropharynx and nasopharynx clear.  NECK:  Supple, + jugular venous distention. No thyroid enlargement, no tenderness.  LUNGS: Poor Resp. Effort, Prolonged Insp & Exp. Phase. no wheezing, bibasilar rales, No rhonchi. No use of accessory muscles of respiration.  CARDIOVASCULAR: S1, S2 normal. No murmurs, rubs, or gallops.   ABDOMEN: Soft, nontender, nondistended. Bowel sounds present. No organomegaly or mass.  EXTREMITIES: No cyanosis, clubbing, + 1 edema b/l    NEUROLOGIC: Cranial nerves II through XII are intact. No focal Motor or sensory deficits b/l.   PSYCHIATRIC: The patient is alert and oriented x 3.  SKIN: No obvious rash, lesion, or ulcer. Shingles noted near the left facial area.  No eye involvement.    LABORATORY PANEL:   CBC Recent Labs  Lab 05/17/17 0913  WBC 6.9  HGB 10.0*  HCT 31.2*  PLT 219   ------------------------------------------------------------------------------------------------------------------  Chemistries  Recent Labs  Lab 05/14/17 1553  05/18/17 0558  NA 128*   < > 137  K 4.0   < > 3.3*  CL 96*   < > 100*  CO2 19*   < > 26  GLUCOSE 342*   < > 128*  BUN 13   < > 6  CREATININE 1.24*   < > 0.72  CALCIUM 9.1   < > 8.5*  MG 1.3*  --   --   AST 28  --   --   ALT 16  --   --   ALKPHOS 101  --   --   BILITOT 1.1  --   --    < > = values in this interval not displayed.   ------------------------------------------------------------------------------------------------------------------  Cardiac Enzymes Recent Labs  Lab 05/17/17 0913  TROPONINI 0.03*   ------------------------------------------------------------------------------------------------------------------  RADIOLOGY:  Ct Angio Chest Pe W And/or Wo Contrast  Result Date: 05/17/2017 CLINICAL DATA:  Worsening shortness of breath. EXAM: CT ANGIOGRAPHY CHEST WITH CONTRAST TECHNIQUE: Multidetector CT imaging of the chest was performed using the standard protocol during bolus  administration of intravenous contrast. Multiplanar CT image reconstructions and MIPs were obtained to evaluate the vascular anatomy. CONTRAST:  ISOVUE-370 IOPAMIDOL (ISOVUE-370) INJECTION 76% COMPARISON:  None. FINDINGS: Cardiovascular: The heart size is normal. No pericardial effusion. No thoracic aortic aneurysm. No filling defect  within the opacified pulmonary arteries to suggest acute pulmonary embolus. Mediastinum/Nodes: 13 mm short axis high right paratracheal lymph node is seen image 23/series 5. No other mediastinal lymphadenopathy evident. No hilar lymphadenopathy. The esophagus has normal imaging features. There is no axillary lymphadenopathy. Lungs/Pleura: Patchy and nodular airspace disease is seen in the upper lungs with more confluent collapse/consolidation in the dependent lower lobes bilaterally. Small right and tiny left pleural effusions evident. Upper Abdomen: Unremarkable. Musculoskeletal: Bone windows reveal no worrisome lytic or sclerotic osseous lesions. Review of the MIP images confirms the above findings. IMPRESSION: 1. No CT evidence for acute pulmonary embolus. 2. Patchy and nodular airspace disease in both lungs with more confluent collapse/consolidation in the lower lobes bilaterally. Given a somewhat dependent distribution, pulmonary edema would be a consideration. Diffuse infection could also have this appearance. 3. Small bilateral pleural effusions. 4. Borderline right paratracheal lymph node may be reactive. Electronically Signed   By: Kennith Center M.D.   On: 05/17/2017 11:20   Dg Chest Port 1 View  Result Date: 05/17/2017 CLINICAL DATA:  Shortness of Breath EXAM: PORTABLE CHEST 1 VIEW COMPARISON:  May 14, 2017 FINDINGS: There is patchy interstitial pulmonary edema. There is cardiomegaly with pulmonary venous hypertension. No appreciable consolidation. No adenopathy. No bone lesions. IMPRESSION: Pulmonary vascular congestion with a degree of interstitial pulmonary edema. Suspect a degree of congestive heart failure. No consolidation. Electronically Signed   By: Bretta Bang III M.D.   On: 05/17/2017 09:42     ASSESSMENT AND PLAN:   32 year old female with past medical history of diabetes, hypertension, obesity, suspected sleep apnea, who was recently admitted to the hospital due to sepsis  secondary to UTI returns back due to shortness of breath and respiratory distress and noted to be in congestive heart failure.  1.  CHF-this is acute on chronic diastolic dysfunction.  Patient was recently hospitalized and received significant IV fluids for underlying sepsis.  This is probably the reason patient went to CHF. -Continue diuresis with IV Lasix and patient is improving.  Follow I's and O's and daily weights. -Continue carvedilol  2.  Acute respiratory failure with hypoxia-secondary to CHF.  Continue treatment as mentioned above.  Continue O2 supplementation and wean as tolerated.  3.  Admission for UTI with sepsis- current urinalysis is negative.   -Continue to finish Augmentin course.  4.  Diabetes type 2 without complication- continue sliding scale insulin, Amaryl, metformin.  5.  Shingles- patient noted to have a shingles lesion near the left side of face.  Cont. Airborne precautions.  - cont. Valtrex.   6. Tobacco abuse - cont. Nicotine patch.    All the records are reviewed and case discussed with Care Management/Social Worker. Management plans discussed with the patient, family and they are in agreement.  CODE STATUS: Full code  DVT Prophylaxis: Lovenox  TOTAL TIME TAKING CARE OF THIS PATIENT: 30 minutes.   POSSIBLE D/C IN 1-2 DAYS, DEPENDING ON CLINICAL CONDITION.   Houston Siren M.D on 05/18/2017 at 2:30 PM  Between 7am to 6pm - Pager - [phone]  After 6pm go to [email or web address] - Social research officer, government  [facility] [place] [facility]  Office  [phone]  CC: Primary care physician; System, Pcp Not  In

## 2017-05-19 LAB — CULTURE, BLOOD (ROUTINE X 2)
CULTURE: NO GROWTH
CULTURE: NO GROWTH
Special Requests: ADEQUATE
Special Requests: ADEQUATE

## 2017-05-19 LAB — HERPES SIMPLEX VIRUS(HSV) DNA BY PCR
HSV 1 DNA: NEGATIVE
HSV 2 DNA: NEGATIVE

## 2017-05-20 LAB — CULTURE, BLOOD (ROUTINE X 2): SPECIAL REQUESTS: ADEQUATE

## 2017-05-22 LAB — CULTURE, BLOOD (ROUTINE X 2)
Culture: NO GROWTH
Special Requests: ADEQUATE

## 2017-05-23 LAB — BLOOD GAS, VENOUS
ACID-BASE DEFICIT: 5.4 mmol/L — AB (ref 0.0–2.0)
BICARBONATE: 20.7 mmol/L (ref 20.0–28.0)
PATIENT TEMPERATURE: 37
pCO2, Ven: 42 mmHg — ABNORMAL LOW (ref 44.0–60.0)
pH, Ven: 7.3 (ref 7.250–7.430)

## 2017-05-23 NOTE — Discharge Summary (Signed)
[place] [facility] [place] NAME: [name] Wolf    MR#:  [id number]  DATE OF BIRTH:  [birth year]  DATE OF ADMISSION:  05/17/2017 ADMITTING PHYSICIAN: Gorden Harms, MD  DATE OF DISCHARGE: 05/18/2017  4:00 PM Left AMA  PRIMARY CARE PHYSICIAN: System, Pcp Not In    ADMISSION DIAGNOSIS:  Acute pulmonary edema (HCC) [J81.0] Respiratory distress [R06.03] Hypoxia [R09.02]  DISCHARGE DIAGNOSIS:  Active Problems:   CHF (congestive heart failure) ([place])   SECONDARY DIAGNOSIS:   Past Medical History:  Diagnosis Date  . Gestational diabetes   . Hx of gallstones 6ys old   LAP removal of stones  . Obesity, Class III, BMI 40-49.9 (morbid obesity) ([place])   . Pre-diabetes     HOSPITAL COURSE:   32 year old female with past medical history of diabetes, hypertension, obesity, suspected sleep apnea, who was recently admitted to the hospital due to sepsis secondary to UTI returns back due to shortness of breath and respiratory distress and noted to be in congestive heart failure.  1.  CHF-this is acute on chronic diastolic dysfunction.  Patient was recently hospitalized and received significant IV fluids for underlying sepsis.  This is probably the reason patient went to CHF. -Continue diuresis with IV Lasix and patient is improving.  Follow I's and O's and daily weights. -Continue carvedilol  2.  Acute respiratory failure with hypoxia-secondary to CHF.  Continue treatment as mentioned above.  Continue O2 supplementation and wean as tolerated.  3.  Admission for UTI with sepsis- current urinalysis is negative.   -Continue to finish Augmentin course.  4.  Diabetes type 2 without complication- continue sliding scale insulin, Amaryl, metformin.  5.  Shingles- patient noted to have a shingles lesion near the left side of face.  Cont. Airborne precautions.  - cont. Valtrex.   6. Tobacco abuse - cont. Nicotine patch.   Patient was being treated  as mentioned above but decided to leave [place] as she wanted to be transferred to [facility] and her mother wanted to take her there.  Patient signed the [employer] [facility] forms and left AGAINST MEDICAL ADVICE on the afternoon of May 18, 2017.  DISCHARGE CONDITIONS:   Stable  CONSULTS OBTAINED:    DRUG ALLERGIES:  No Known Allergies  DISCHARGE MEDICATIONS:   Allergies as of 05/18/2017   No Known Allergies     Medication List    ASK your doctor about these medications   amoxicillin-clavulanate 875-125 MG tablet Commonly known as:  AUGMENTIN Take 1 tablet by mouth 2 (two) times daily for 7 days.   blood glucose meter kit and supplies Kit Dispense based on patient and insurance preference. Use up to four times daily as directed. (FOR ICD-9 250.00, 250.01).   glimepiride 1 MG tablet Commonly known as:  AMARYL Take 1 tablet (1 mg total) by mouth daily with breakfast.   metFORMIN 500 MG tablet Commonly known as:  GLUCOPHAGE Take 1 tablet (500 mg total) by mouth 2 (two) times daily with a meal.         DISCHARGE INSTRUCTIONS:   DIET:  Cardiac diet and Diabetic diet  DISCHARGE CONDITION:  Stable  ACTIVITY:  Activity as tolerated  OXYGEN:  Home Oxygen: No.   Oxygen Delivery: room air  DISCHARGE LOCATION:  Left AMA   If you experience worsening of your admission symptoms, develop shortness of breath, life threatening emergency, suicidal or homicidal thoughts you must seek medical attention immediately by calling 911 or  calling your MD immediately  if symptoms less severe.  You Must read complete instructions/literature along with all the possible adverse reactions/side effects for all the Medicines you take and that have been prescribed to you. Take any new Medicines after you have completely understood and accpet all the possible adverse reactions/side effects.   Please note  You were cared for by a hospitalist during your hospital stay. If you have any questions  about your discharge medications or the care you received while you were in the hospital after you are discharged, you can call the unit and asked to speak with the hospitalist on call if the hospitalist that took care of you is not available. Once you are discharged, your primary care physician will handle any further medical issues. Please note that NO REFILLS for any discharge medications will be authorized once you are discharged, as it is imperative that you return to your primary care physician (or establish a relationship with a primary care physician if you do not have one) for your aftercare needs so that they can reassess your need for medications and monitor your lab values.   DATA REVIEW:   CBC Recent Labs  Lab 05/17/17 0913  WBC 6.9  HGB 10.0*  HCT 31.2*  PLT 219    Chemistries  Recent Labs  Lab 05/18/17 0558  NA 137  K 3.3*  CL 100*  CO2 26  GLUCOSE 128*  BUN 6  CREATININE 0.72  CALCIUM 8.5*    Cardiac Enzymes Recent Labs  Lab 05/17/17 0913  TROPONINI 0.03*    Microbiology Results  Results for orders placed or performed during the hospital encounter of 05/17/17  Blood culture (routine x 2)     Status: None   Collection Time: 05/17/17  9:13 AM  Result Value Ref Range Status   Specimen Description BLOOD RIGHT ANTECUBITAL  Final   Special Requests   Final    BOTTLES DRAWN AEROBIC AND ANAEROBIC Blood Culture adequate volume   Culture   Final    NO GROWTH 5 DAYS Performed at [facility], [address], [place], [place] [address]    Report Status 05/22/2017 FINAL  Final  Blood culture (routine x 2)     Status: Abnormal   Collection Time: 05/17/17 11:27 AM  Result Value Ref Range Status   Specimen Description   Final    BLOOD RIGHT ANTECUBITAL Performed at [facility], [address], [place], [place] [address]    Special Requests   Final    BOTTLES DRAWN AEROBIC AND ANAEROBIC Blood Culture adequate volume Performed at [facility], [address] [place], [place] [address]    Culture  Setup Time   Final    GRAM POSITIVE COCCI AEROBIC BOTTLE ONLY CRITICAL RESULT CALLED TO, READ BACK BY AND VERIFIED WITH: HANK ZOMPA AT 1027 05/18/17 SDR    Culture (A)  Final    STAPHYLOCOCCUS SPECIES (COAGULASE NEGATIVE) THE SIGNIFICANCE OF ISOLATING THIS ORGANISM FROM A SINGLE SET OF BLOOD CULTURES WHEN MULTIPLE SETS ARE DRAWN IS UNCERTAIN. PLEASE NOTIFY THE MICROBIOLOGY DEPARTMENT WITHIN ONE WEEK IF SPECIATION AND SENSITIVITIES ARE REQUIRED. Performed at [facility], [place] [address], [place], [place] [address]    Report Status 05/20/2017 FINAL  Final  Blood Culture ID Panel (Reflexed)     Status: Abnormal   Collection Time: 05/17/17 11:27 AM  Result Value Ref Range Status   Enterococcus species NOT DETECTED NOT DETECTED Final   Listeria monocytogenes  NOT DETECTED NOT DETECTED Final   Staphylococcus species DETECTED (A) NOT DETECTED Final    Comment: Methicillin (oxacillin) resistant coagulase negative staphylococcus. Possible blood culture contaminant (unless isolated from more than one blood culture draw or clinical case suggests pathogenicity). No antibiotic treatment is indicated for blood  culture contaminants. CRITICAL RESULT CALLED TO, READ BACK BY AND VERIFIED WITH:  HANK ZOMPA AT 0223 05/18/17 SDR    Staphylococcus aureus NOT DETECTED NOT DETECTED Final   Methicillin resistance DETECTED (A) NOT DETECTED Final    Comment: CRITICAL RESULT CALLED TO, READ BACK BY AND VERIFIED WITH: HANK ZOMPA AT 3612 05/18/17 SDR    Streptococcus species NOT DETECTED NOT DETECTED Final   Streptococcus agalactiae NOT DETECTED NOT DETECTED Final   Streptococcus pneumoniae NOT DETECTED NOT DETECTED Final   Streptococcus pyogenes NOT DETECTED NOT DETECTED Final   Acinetobacter baumannii NOT DETECTED NOT DETECTED Final   Enterobacteriaceae species NOT DETECTED NOT DETECTED Final   Enterobacter cloacae complex NOT DETECTED  NOT DETECTED Final   Escherichia coli NOT DETECTED NOT DETECTED Final   Klebsiella oxytoca NOT DETECTED NOT DETECTED Final   Klebsiella pneumoniae NOT DETECTED NOT DETECTED Final   Proteus species NOT DETECTED NOT DETECTED Final   Serratia marcescens NOT DETECTED NOT DETECTED Final   Haemophilus influenzae NOT DETECTED NOT DETECTED Final   Neisseria meningitidis NOT DETECTED NOT DETECTED Final   Pseudomonas aeruginosa NOT DETECTED NOT DETECTED Final   Candida albicans NOT DETECTED NOT DETECTED Final   Candida glabrata NOT DETECTED NOT DETECTED Final   Candida krusei NOT DETECTED NOT DETECTED Final   Candida parapsilosis NOT DETECTED NOT DETECTED Final   Candida tropicalis NOT DETECTED NOT DETECTED Final    Comment: Performed at [facility], [address], [place], [place] [address]    RADIOLOGY:  No results found.    Management plans discussed with the patient, family and they are in agreement.  CODE STATUS:  Code Status History    Date Active Date Inactive Code Status Order ID Comments User Context   05/17/2017 1402 05/18/2017 2056 Full Code [id number]  Salary, Avel Peace, MD Inpatient   TOTAL TIME TAKING CARE OF THIS PATIENT: 40 minutes.    Henreitta Leber M.D on 05/23/2017 at 1:54 PM  Between 7am to 6pm - Pager - [phone]  After 6pm go to [email or web address] - Proofreader  [facility] [place] [facility]  Office  [phone]  CC: Primary care physician; System, Pcp Not In

## 2017-05-26 ENCOUNTER — Ambulatory Visit: Payer: Self-pay | Admitting: Family

## 2017-06-03 NOTE — Progress Notes (Signed)
Patient ID: [name] Wolf, female    DOB: June 12, 1985, 32 y.o.   MRN: [id number]  HPI  [name] Wolf is a 32 y/o female with a history of HTN, DM, obesity, chronic tobacco use and chronic heart failure.   Echo report from 05/17/17 was reviewed and showed an EF of 60-65%.  Admitted 05/17/17 due to acute on chronic heart failure. Had recently been given significant IV fluids in previous hospitalization. Initially needed IV lasix and then transitioned to oral diuretics. Patient left AMA the next day saying her mother was taking her to [facility]. Admitted 05/14/17 due to sepsis thought to be due to UTI. Given IV fluids and antibiotics. Discharged after 2 days.  She presents today for her initial visit with a chief complaint of intermittent palpitations. She says that she's noticed these over the last several weeks but that they occur infrequently. She has associated light-headedness, depression and difficulty sleeping along with this. She denies any abdominal distention, edema, chest pain, shortness of breath, cough or fatigue.   Past Medical History:  Diagnosis Date  . CHF (congestive heart failure) ([place])   . Gestational diabetes   . Hx of gallstones 52ys old   LAP removal of stones  . Hypertension   . Obesity, Class III, BMI 40-49.9 (morbid obesity) ([place])   . Pre-diabetes    Past Surgical History:  Procedure Laterality Date  . LAPAROSCOPY  32 yo   gallstones removed   Family History  Problem Relation Age of Onset  . Hypertension Mother   . Diabetes Sister   . Migraines Sister    Social History   Tobacco Use  . Smoking status: Current Some Day Smoker    Packs/day: 1.00    Types: Cigarettes  . Smokeless tobacco: Never Used  Substance Use Topics  . Alcohol use: No    Alcohol/week: 0.0 oz   No Known Allergies Prior to Admission medications   Medication Sig Start Date End Date Taking? Authorizing Provider  blood glucose meter kit and supplies KIT Dispense based on patient and  insurance preference. Use up to four times daily as directed. (FOR ICD-9 250.00, 250.01). 05/16/17  Yes Sainani, Belia Heman, MD  glimepiride (AMARYL) 1 MG tablet Take 1 tablet (1 mg total) by mouth daily with breakfast. 05/16/17 07/15/17 Yes Sainani, Belia Heman, MD  metFORMIN (GLUCOPHAGE) 500 MG tablet Take 1 tablet (500 mg total) by mouth 2 (two) times daily with a meal. 05/16/17 07/15/17 Yes Sainani, Belia Heman, MD    Review of Systems  Constitutional: Negative for appetite change and fatigue.  HENT: Positive for congestion. Negative for postnasal drip and sore throat.   Eyes: Negative.   Respiratory: Negative for cough, chest tightness and shortness of breath.   Cardiovascular: Positive for palpitations. Negative for chest pain and leg swelling.  Gastrointestinal: Negative for abdominal distention and abdominal pain.  Endocrine: Negative.   Genitourinary: Negative.   Musculoskeletal: Negative for back pain and neck pain.  Skin: Negative.   Allergic/Immunologic: Negative.   Neurological: Positive for light-headedness.  Hematological: Negative for adenopathy. Does not bruise/bleed easily.  Psychiatric/Behavioral: Positive for dysphoric mood and sleep disturbance (sleeping on 1 pillow). The patient is not nervous/anxious.    Vitals:   06/06/17 1200  BP: 100/78  Pulse: 95  Resp: 18  SpO2: 99%  Weight: 261 lb (118.4 kg)  Height: _0  (1.6 m)   Wt Readings from Last 3 Encounters:  06/06/17 261 lb (118.4 kg)  05/18/17 257 lb 11.2 oz (116.9  kg)  05/16/17 278 lb 7.1 oz (126.3 kg)    Lab Results  Component Value Date   CREATININE 0.72 05/18/2017   CREATININE 0.78 05/17/2017   CREATININE 0.98 05/16/2017    Physical Exam  Constitutional: She is oriented to person, place, and time. She appears well-developed and well-nourished.  HENT:  Head: Normocephalic and atraumatic.  Neck: Normal range of motion. Neck supple. No JVD present.  Cardiovascular: Normal rate and regular rhythm.   Pulmonary/Chest: Effort normal. She has no wheezes. She has no rales.  Abdominal: Soft. She exhibits no distension. There is no tenderness.  Musculoskeletal: She exhibits no edema or tenderness.  Neurological: She is alert and oriented to person, place, and time.  Skin: Skin is warm and dry.  Psychiatric: She has a normal mood and affect. Her behavior is normal. Thought content normal.  Nursing note and vitals reviewed.   Assessment & Plan:  1: Chronic heart failure with preserved ejection fraction- - NYHA class I - euvolemic today - not weighing daily as her scale needs new batteries in it. She is going to get some today and she was instructed to begin weighing every morning after using the bathroom and to call for an overnight weight gain of >2 pounds or a weekly weight gain of >5 pounds - not adding salt and she's been reading food labels. Discussed the importance of closely following a 20106m sodium diet and written dietary information was given to her about this - does walk her daughter to/from the bus stop daily. Encouraged her to increase her activity slowly - BNP 05/17/17 was 188.0  2: HTN- - BP looks great today - BMP 05/18/17 reviewed and showed sodium 137, potassium 3.3 and GFR >60  3: Diabetes- - fasting glucose at home this morning was 128 - currently does not have a PCP and she was encouraged to get established with someone since her insurance is "pending" - A1c 05/15/17 was 8.3%  4: Tobacco use- - smoking 1 cigarette daily - complete cessation discussed for 3 minutes with her  Patient did not bring her medications nor a list. Each medication was verbally reviewed with the patient and she was encouraged to bring the bottles to every visit to confirm accuracy of list.  Return in 1 month or sooner for any questions/problems before then.

## 2017-06-06 ENCOUNTER — Encounter: Payer: Self-pay | Admitting: Family

## 2017-06-06 ENCOUNTER — Ambulatory Visit: Payer: Self-pay | Attending: Family | Admitting: Family

## 2017-06-06 VITALS — BP 100/78 | HR 95 | Resp 18 | Ht 63.0 in | Wt 261.0 lb

## 2017-06-06 DIAGNOSIS — Z6841 Body Mass Index (BMI) 40.0 and over, adult: Secondary | ICD-10-CM | POA: Insufficient documentation

## 2017-06-06 DIAGNOSIS — Z720 Tobacco use: Secondary | ICD-10-CM

## 2017-06-06 DIAGNOSIS — F17210 Nicotine dependence, cigarettes, uncomplicated: Secondary | ICD-10-CM | POA: Insufficient documentation

## 2017-06-06 DIAGNOSIS — I5032 Chronic diastolic (congestive) heart failure: Secondary | ICD-10-CM

## 2017-06-06 DIAGNOSIS — I509 Heart failure, unspecified: Secondary | ICD-10-CM | POA: Insufficient documentation

## 2017-06-06 DIAGNOSIS — E1165 Type 2 diabetes mellitus with hyperglycemia: Secondary | ICD-10-CM | POA: Insufficient documentation

## 2017-06-06 DIAGNOSIS — I110 Hypertensive heart disease with heart failure: Secondary | ICD-10-CM | POA: Insufficient documentation

## 2017-06-06 DIAGNOSIS — Z7984 Long term (current) use of oral hypoglycemic drugs: Secondary | ICD-10-CM | POA: Insufficient documentation

## 2017-06-06 DIAGNOSIS — E119 Type 2 diabetes mellitus without complications: Secondary | ICD-10-CM | POA: Insufficient documentation

## 2017-06-06 DIAGNOSIS — I10 Essential (primary) hypertension: Secondary | ICD-10-CM | POA: Insufficient documentation

## 2017-06-06 NOTE — Patient Instructions (Addendum)
Begin weighing daily and call for an overnight weight gain of > 2 pounds or a weekly weight gain of >5 pounds. 

## 2017-07-01 NOTE — Progress Notes (Deleted)
Patient ID: [name] Wolf, female    DOB: [birth year], 32 y.o.   MRN: [id number]  HPI  [name] Wolf is a 32 y/o female with a history of HTN, DM, obesity, chronic tobacco use and chronic heart failure.   Echo report from 05/17/17 was reviewed and showed an EF of 60-65%.  Admitted 05/17/17 due to acute on chronic heart failure. Had recently been given significant IV fluids in previous hospitalization. Initially needed IV lasix and then transitioned to oral diuretics. Patient left AMA the next day saying her mother was taking her to [facility]. Admitted 05/14/17 due to sepsis thought to be due to UTI. Given IV fluids and antibiotics. Discharged after 2 days.  She presents today for a follow-up visit with a chief complaint of   Past Medical History:  Diagnosis Date  . CHF (congestive heart failure) (HCC)   . Gestational diabetes   . Hx of gallstones 83ys old   LAP removal of stones  . Hypertension   . Obesity, Class III, BMI 40-49.9 (morbid obesity) (HCC)   . Pre-diabetes    Past Surgical History:  Procedure Laterality Date  . LAPAROSCOPY  32 yo   gallstones removed   Family History  Problem Relation Age of Onset  . Hypertension Mother   . Diabetes Sister   . Migraines Sister    Social History   Tobacco Use  . Smoking status: Current Some Day Smoker    Packs/day: 1.00    Types: Cigarettes  . Smokeless tobacco: Never Used  Substance Use Topics  . Alcohol use: No    Alcohol/week: 0.0 oz   No Known Allergies   Review of Systems  Constitutional: Negative for appetite change and fatigue.  HENT: Positive for congestion. Negative for postnasal drip and sore throat.   Eyes: Negative.   Respiratory: Negative for cough, chest tightness and shortness of breath.   Cardiovascular: Positive for palpitations. Negative for chest pain and leg swelling.  Gastrointestinal: Negative for abdominal distention and abdominal pain.  Endocrine: Negative.   Genitourinary: Negative.    Musculoskeletal: Negative for back pain and neck pain.  Skin: Negative.   Allergic/Immunologic: Negative.   Neurological: Positive for light-headedness.  Hematological: Negative for adenopathy. Does not bruise/bleed easily.  Psychiatric/Behavioral: Positive for dysphoric mood and sleep disturbance (sleeping on 1 pillow). The patient is not nervous/anxious.      Physical Exam  Constitutional: She is oriented to person, place, and time. She appears well-developed and well-nourished.  HENT:  Head: Normocephalic and atraumatic.  Neck: Normal range of motion. Neck supple. No JVD present.  Cardiovascular: Normal rate and regular rhythm.  Pulmonary/Chest: Effort normal. She has no wheezes. She has no rales.  Abdominal: Soft. She exhibits no distension. There is no tenderness.  Musculoskeletal: She exhibits no edema or tenderness.  Neurological: She is alert and oriented to person, place, and time.  Skin: Skin is warm and dry.  Psychiatric: She has a normal mood and affect. Her behavior is normal. Thought content normal.  Nursing note and vitals reviewed.   Assessment & Plan:  1: Chronic heart failure with preserved ejection fraction- - NYHA class I - euvolemic today - not weighing daily as her scale needs new batteries in it. Reminded to call for an overnight weight gain of >2 pounds or a weekly weight gain of >5 pounds - not adding salt and she's been reading food labels. Reviewed the importance of closely following a  sodium diet  - does walk her daughter to/from  the bus stop daily. Encouraged her to increase her activity slowly - BNP 05/17/17 was 188.0  2: HTN- - BP looks great today - BMP 05/18/17 reviewed and showed sodium 137, potassium 3.3 and GFR >60  3: Diabetes- - fasting glucose at home this morning was  - currently does not have a PCP and she was encouraged to get established with someone since her insurance is "pending" - A1c 05/15/17 was 8.3%  4: Tobacco use- -  smoking 1 cigarette daily - complete cessation discussed for 3 minutes with her  Patient did not bring her medications nor a list. Each medication was verbally reviewed with the patient and she was encouraged to bring the bottles to every visit to confirm accuracy of list.

## 2017-07-04 ENCOUNTER — Telehealth: Payer: Self-pay | Admitting: Family

## 2017-07-04 ENCOUNTER — Ambulatory Visit: Payer: Self-pay | Admitting: Family

## 2017-07-04 NOTE — Telephone Encounter (Signed)
Patient did not show for her [facility] appointment on 07/04/17. Will attempt to reschedule.  

## 2017-10-10 ENCOUNTER — Emergency Department: Payer: Medicaid Other

## 2017-10-10 ENCOUNTER — Encounter: Payer: Self-pay | Admitting: Emergency Medicine

## 2017-10-10 ENCOUNTER — Emergency Department
Admission: EM | Admit: 2017-10-10 | Discharge: 2017-10-10 | Disposition: A | Discharge status: Home / Self Care | Payer: Medicaid Other | Attending: Emergency Medicine | Admitting: Emergency Medicine

## 2017-10-10 ENCOUNTER — Other Ambulatory Visit: Payer: Self-pay

## 2017-10-10 DIAGNOSIS — M7072 Other bursitis of hip, left hip: Secondary | ICD-10-CM | POA: Insufficient documentation

## 2017-10-10 DIAGNOSIS — E119 Type 2 diabetes mellitus without complications: Secondary | ICD-10-CM | POA: Insufficient documentation

## 2017-10-10 DIAGNOSIS — F17210 Nicotine dependence, cigarettes, uncomplicated: Secondary | ICD-10-CM | POA: Diagnosis not present

## 2017-10-10 DIAGNOSIS — I509 Heart failure, unspecified: Secondary | ICD-10-CM | POA: Diagnosis not present

## 2017-10-10 DIAGNOSIS — I110 Hypertensive heart disease with heart failure: Secondary | ICD-10-CM | POA: Insufficient documentation

## 2017-10-10 DIAGNOSIS — M25552 Pain in left hip: Secondary | ICD-10-CM | POA: Diagnosis present

## 2017-10-10 DIAGNOSIS — Z79899 Other long term (current) drug therapy: Secondary | ICD-10-CM | POA: Diagnosis not present

## 2017-10-10 DIAGNOSIS — Y9302 Activity, running: Secondary | ICD-10-CM | POA: Diagnosis not present

## 2017-10-10 LAB — URINALYSIS, COMPLETE (UACMP) WITH MICROSCOPIC
Bacteria, UA: NONE SEEN
Bilirubin Urine: NEGATIVE
Hgb urine dipstick: NEGATIVE
Ketones, ur: NEGATIVE mg/dL
Leukocytes, UA: NEGATIVE
Nitrite: NEGATIVE
PH: 6 (ref 5.0–8.0)
PROTEIN: NEGATIVE mg/dL
SPECIFIC GRAVITY, URINE: 1.033 — AB (ref 1.005–1.030)

## 2017-10-10 LAB — POCT PREGNANCY, URINE: Preg Test, Ur: NEGATIVE

## 2017-10-10 MED ORDER — IBUPROFEN 600 MG PO TABS: 600.0000 mg | ORAL_TABLET | Freq: Four times a day (QID) | ORAL | 0 refills | Status: DC | PRN

## 2017-10-10 MED ORDER — LIDOCAINE 5 % EX PTCH: 1.0000 | MEDICATED_PATCH | CUTANEOUS | 0 refills | Status: DC

## 2017-10-10 MED ORDER — LIDOCAINE 5 % EX PTCH
1.0000 | MEDICATED_PATCH | CUTANEOUS | Status: DC
  Administered 2017-10-10: 1 via TRANSDERMAL
  Filled 2017-10-10: qty 1

## 2017-10-10 MED ORDER — CYCLOBENZAPRINE HCL 5 MG PO TABS: ORAL_TABLET | ORAL | 0 refills | Status: DC

## 2017-10-10 MED ORDER — KETOROLAC TROMETHAMINE 30 MG/ML IJ SOLN
30.0000 mg | Freq: Once | INTRAMUSCULAR | Status: AC
  Administered 2017-10-10: 30 mg via INTRAMUSCULAR
  Filled 2017-10-10: qty 1

## 2017-10-10 NOTE — ED Notes (Signed)
Returned from x-ray via wheelchair.

## 2017-10-10 NOTE — ED Provider Notes (Signed)
[facility] Emergency Department Provider Note  ____________________________________________  Time seen: Approximately 7:24 AM  I have reviewed the triage vital signs and the nursing notes.   HISTORY  Chief Complaint Hip Pain    HPI Smith Mcnicholas is a 32 y.o. female that presents to the emergency department for evaluation of left hip pain for 1 month.  Patient states that she was chasing her 42-year-old around about 1 month ago and she twisted her ankle and fell.  Her left hip has been hurting ever since.  Pain is worse with ambulation.  She takes metformin and glimeepiride for her diabetes.  No abdominal pain, back pain, urinary symptoms, vaginal discharge.   Past Medical History:  Diagnosis Date  . CHF (congestive heart failure) ([place])   . Gestational diabetes   . Hx of gallstones 12ys old   LAP removal of stones  . Hypertension   . Obesity, Class III, BMI 40-49.9 (morbid obesity) ([place])   . Pre-diabetes     Patient Active Problem List   Diagnosis Date Noted  . HTN (hypertension) 06/06/2017  . Diabetes ([place]) 06/06/2017  . CHF (congestive heart failure) ([place]) 05/17/2017  . Anemia of pregnancy 10/12/2015  . Family history of congenital anomalies   . Family or maternal historic risk of congenital anomaly 04/10/2015  . Tobacco abuse 04/10/2015  . Genetic screening 04/10/2015  . Rubella non-immune status, antepartum 04/10/2015  . Acanthosis nigricans 05/08/2013  . Acid reflux 05/08/2013  . Snores 05/08/2013    Past Surgical History:  Procedure Laterality Date  . LAPAROSCOPY  32 yo   gallstones removed    Prior to Admission medications   Medication Sig Start Date End Date Taking? Authorizing Provider  blood glucose meter kit and supplies KIT Dispense based on patient and insurance preference. Use up to four times daily as directed. (FOR ICD-9 250.00, 250.01). 05/16/17  Yes Sainani, Belia Heman, MD  glimepiride (AMARYL) 1 MG tablet Take 1 tablet (1  mg total) by mouth daily with breakfast. 05/16/17 10/10/17 Yes Henreitta Leber, MD  metFORMIN (GLUCOPHAGE) 500 MG tablet Take 1 tablet (500 mg total) by mouth 2 (two) times daily with a meal. 05/16/17 10/10/17 Yes Sainani, Belia Heman, MD  cyclobenzaprine (FLEXERIL) 5 MG tablet Take 1-2 tablets 3 times daily as needed 10/10/17   Laban Emperor, PA-C  ibuprofen (ADVIL,MOTRIN) 600 MG tablet Take 1 tablet (600 mg total) by mouth every 6 (six) hours as needed. 10/10/17   Laban Emperor, PA-C  lidocaine (LIDODERM) 5 % Place 1 patch onto the skin daily. Remove & Discard patch within 12 hours or as directed by MD 10/10/17   Laban Emperor, PA-C    Allergies Patient has no known allergies.  Family History  Problem Relation Age of Onset  . Hypertension Mother   . Diabetes Sister   . Migraines Sister     Social History Social History   Tobacco Use  . Smoking status: Current Some Day Smoker    Packs/day: 1.00    Types: Cigarettes  . Smokeless tobacco: Never Used  Substance Use Topics  . Alcohol use: No    Alcohol/week: 0.0 standard drinks  . Drug use: No     Review of Systems  Cardiovascular: No chest pain. Respiratory: No SOB. Gastrointestinal: No abdominal pain.  No nausea, no vomiting.  Musculoskeletal: Positive for hip pain. Skin: Negative for rash, abrasions, lacerations, ecchymosis. Neurological: Negative for headaches, numbness or tingling   ____________________________________________   PHYSICAL EXAM:  VITAL SIGNS:  ED Triage Vitals  Enc Vitals Group     BP 10/10/17 0710 139/76     Pulse Rate 10/10/17 0710 95     Resp 10/10/17 0710 16     Temp 10/10/17 0710 98.5 F (36.9 C)     Temp Source 10/10/17 0710 Oral     SpO2 10/10/17 0710 97 %     Weight 10/10/17 0711 261 lb (118.4 kg)     Height 10/10/17 0711 5' 3"  (1.6 m)     Head Circumference --      Peak Flow --      Pain Score 10/10/17 0722 8     Pain Loc --      Pain Edu? --      Excl. in [place]? --       Constitutional: Alert and oriented. Well appearing and in no acute distress. Eyes: Conjunctivae are normal. PERRL. EOMI. Head: Atraumatic. ENT:      Ears:      Nose: No congestion/rhinnorhea.      Mouth/Throat: Mucous membranes are moist.  Neck: No stridor.   Cardiovascular: Normal rate, regular rhythm.  Good peripheral circulation. Respiratory: Normal respiratory effort without tachypnea or retractions. Lungs CTAB. Good air entry to the bases with no decreased or absent breath sounds. Gastrointestinal: Bowel sounds 4 quadrants. Soft and nontender to palpation. No guarding or rigidity. No palpable masses. No distention.  Musculoskeletal: Full range of motion to all extremities. No gross deformities appreciated.  Tenderness to palpation over left trochanteric bursa.  Full range of motion of hip.  Weightbearing.  Antalgic gait. Neurologic:  Normal speech and language. No gross focal neurologic deficits are appreciated.  Skin:  Skin is warm, dry and intact. No rash noted. Psychiatric: Mood and affect are normal. Speech and behavior are normal. Patient exhibits appropriate insight and judgement.   ____________________________________________   LABS (all labs ordered are listed, but only abnormal results are displayed)  Labs Reviewed  URINALYSIS, COMPLETE (UACMP) WITH MICROSCOPIC - Abnormal; Notable for the following components:      Result Value   Color, Urine YELLOW (*)    APPearance CLEAR (*)    Specific Gravity, Urine 1.033 (*)    Glucose, UA >=500 (*)    All other components within normal limits  POC URINE PREG, ED  POCT PREGNANCY, URINE   ____________________________________________  EKG   ____________________________________________  RADIOLOGY Robinette Haines, personally viewed and evaluated these images (plain radiographs) as part of my medical decision making, as well as reviewing the written report by the radiologist.  Dg Hip Unilat W Or Wo Pelvis 2-3 Views  Left  Result Date: 10/10/2017 CLINICAL DATA:  Recent fall 1 month ago with persistent left hip pain, initial encounter EXAM: DG HIP (WITH OR WITHOUT PELVIS) 3V LEFT COMPARISON:  None. FINDINGS: Pelvic ring is intact. No acute fracture or dislocation is noted. No soft tissue changes are seen. IMPRESSION: No acute abnormality noted. Electronically Signed   By: Inez Catalina M.D.   On: 10/10/2017 08:28    ____________________________________________    PROCEDURES  Procedure(s) performed:    Procedures    Medications  ketorolac (TORADOL) 30 MG/ML injection 30 mg (30 mg Intramuscular Given 10/10/17 0846)     ____________________________________________   INITIAL IMPRESSION / ASSESSMENT AND PLAN / ED COURSE  Pertinent labs & imaging results that were available during my care of the patient were reviewed by me and considered in my medical decision making (see chart for details).  Review of the  [place] CSRS was performed in accordance of the [place] prior to dispensing any controlled drugs.   Patient's diagnosis is consistent with bursitis.  Vital signs and exam are reassuring.  Hip x-ray negative for acute bony abnormalities.  Pain improved with IM Toradol.  Patient will be discharged home with prescriptions for ibuprofen, Flexeril, Lidoderm. Patient is to follow up with PCP as directed.  She is also agreeable to follow-up with primary care for continued diabetes management.  Patient is given ED precautions to return to the ED for any worsening or new symptoms.     ____________________________________________  FINAL CLINICAL IMPRESSION(S) / ED DIAGNOSES  Final diagnoses:  Left hip pain  Bursitis of left hip, unspecified bursa      NEW MEDICATIONS STARTED DURING THIS VISIT:  ED Discharge Orders         Ordered    ibuprofen (ADVIL,MOTRIN) 600 MG tablet  Every 6 hours PRN     10/10/17 0905    cyclobenzaprine (FLEXERIL) 5 MG tablet     10/10/17 0905    lidocaine (LIDODERM) 5 %   Every 24 hours     10/10/17 0905              This chart was dictated using voice recognition software/Dragon. Despite best efforts to proofread, errors can occur which can change the meaning. Any change was purely unintentional.    Laban Emperor, PA-C 10/10/17 1436    Nena Polio, MD 10/10/17 1450

## 2017-10-10 NOTE — ED Triage Notes (Signed)
Left hip pain since she fell last month.  Has not gotten better. Ambulates slowly to room.

## 2018-04-21 ENCOUNTER — Other Ambulatory Visit: Payer: Self-pay

## 2018-04-21 ENCOUNTER — Encounter: Payer: Self-pay | Admitting: Emergency Medicine

## 2018-04-21 ENCOUNTER — Emergency Department
Admission: EM | Admit: 2018-04-21 | Discharge: 2018-04-21 | Disposition: A | Discharge status: Home / Self Care | Payer: Medicaid Other | Attending: Emergency Medicine | Admitting: Emergency Medicine

## 2018-04-21 DIAGNOSIS — N9089 Other specified noninflammatory disorders of vulva and perineum: Secondary | ICD-10-CM | POA: Diagnosis present

## 2018-04-21 DIAGNOSIS — I110 Hypertensive heart disease with heart failure: Secondary | ICD-10-CM | POA: Diagnosis not present

## 2018-04-21 DIAGNOSIS — L731 Pseudofolliculitis barbae: Secondary | ICD-10-CM | POA: Insufficient documentation

## 2018-04-21 DIAGNOSIS — I509 Heart failure, unspecified: Secondary | ICD-10-CM | POA: Diagnosis not present

## 2018-04-21 DIAGNOSIS — Z79899 Other long term (current) drug therapy: Secondary | ICD-10-CM | POA: Insufficient documentation

## 2018-04-21 DIAGNOSIS — F17210 Nicotine dependence, cigarettes, uncomplicated: Secondary | ICD-10-CM | POA: Diagnosis not present

## 2018-04-21 DIAGNOSIS — R102 Pelvic and perineal pain: Secondary | ICD-10-CM

## 2018-04-21 MED ORDER — FLUCONAZOLE 150 MG PO TABS: ORAL_TABLET | ORAL | 0 refills | Status: DC

## 2018-04-21 MED ORDER — CEPHALEXIN 500 MG PO CAPS: 500.0000 mg | ORAL_CAPSULE | Freq: Three times a day (TID) | ORAL | 0 refills | Status: DC

## 2018-04-21 NOTE — ED Triage Notes (Signed)
Pt to ed with c/o "knot" in vaginal area, states noticed last night for the first time, it was small in nature and mildly painful, however this am, awoke with "knot" much larger and swollen with increased pain.

## 2018-04-21 NOTE — Discharge Instructions (Addendum)
Follow-up with your regular doctor if not improving in 3 days.  Return to the emergency department if worsening.  Take medications as prescribed.  Soak in a tub of warm water with Epson salts.

## 2018-04-21 NOTE — ED Provider Notes (Signed)
[facility] Emergency Department Provider Note  ____________________________________________   First MD Initiated Contact with Patient 04/21/18 1353     (approximate)  I have reviewed the triage vital signs and the nursing notes.   HISTORY  Chief Complaint Groin Pain    HPI [name] Wolf is a 33 y.o. female presents to the emergency department complaining of pain in her vagina.  Dates she noted a knot yesterday and the knot got little bigger today.  She denies any fever chills.  She denies any vaginal discharge.    Past Medical History:  Diagnosis Date  . CHF (congestive heart failure) ([place])   . Gestational diabetes   . Hx of gallstones 72ys old   LAP removal of stones  . Hypertension   . Obesity, Class III, BMI 40-49.9 (morbid obesity) ([place])   . Pre-diabetes     Patient Active Problem List   Diagnosis Date Noted  . HTN (hypertension) 06/06/2017  . Diabetes ([place]) 06/06/2017  . CHF (congestive heart failure) ([place]) 05/17/2017  . Anemia of pregnancy 10/12/2015  . Family history of congenital anomalies   . Family or maternal historic risk of congenital anomaly 04/10/2015  . Tobacco abuse 04/10/2015  . Genetic screening 04/10/2015  . Rubella non-immune status, antepartum 04/10/2015  . Acanthosis nigricans 05/08/2013  . Acid reflux 05/08/2013  . Snores 05/08/2013    Past Surgical History:  Procedure Laterality Date  . LAPAROSCOPY  33 yo   gallstones removed    Prior to Admission medications   Medication Sig Start Date End Date Taking? Authorizing Provider  blood glucose meter kit and supplies KIT Dispense based on patient and insurance preference. Use up to four times daily as directed. (FOR ICD-9 250.00, 250.01). 05/16/17   Henreitta Leber, MD  cephALEXin (KEFLEX) 500 MG capsule Take 1 capsule (500 mg total) by mouth 3 (three) times daily. 04/21/18   Caryn Section Linden Dolin, PA-C  cyclobenzaprine (FLEXERIL) 5 MG tablet Take 1-2 tablets 3 times  daily as needed 10/10/17   Laban Emperor, PA-C  fluconazole (DIFLUCAN) 150 MG tablet Take one now and one in a week 04/21/18   Caryn Section, Linden Dolin, PA-C  glimepiride (AMARYL) 1 MG tablet Take 1 tablet (1 mg total) by mouth daily with breakfast. 05/16/17 10/10/17  Henreitta Leber, MD  ibuprofen (ADVIL,MOTRIN) 600 MG tablet Take 1 tablet (600 mg total) by mouth every 6 (six) hours as needed. 10/10/17   Laban Emperor, PA-C  lidocaine (LIDODERM) 5 % Place 1 patch onto the skin daily. Remove & Discard patch within 12 hours or as directed by MD 10/10/17   Laban Emperor, PA-C  metFORMIN (GLUCOPHAGE) 500 MG tablet Take 1 tablet (500 mg total) by mouth 2 (two) times daily with a meal. 05/16/17 10/10/17  Henreitta Leber, MD    Allergies Patient has no known allergies.  Family History  Problem Relation Age of Onset  . Hypertension Mother   . Diabetes Sister   . Migraines Sister     Social History Social History   Tobacco Use  . Smoking status: Current Some Day Smoker    Packs/day: 1.00    Types: Cigarettes  . Smokeless tobacco: Never Used  Substance Use Topics  . Alcohol use: No    Alcohol/week: 0.0 standard drinks  . Drug use: No    Review of Systems  Constitutional: No fever/chills Eyes: No visual changes. ENT: No sore throat. Respiratory: Denies cough Genitourinary: Negative for dysuria.  Positive for a knot  on her vagina Musculoskeletal: Negative for back pain. Skin: Negative for rash.    ____________________________________________   PHYSICAL EXAM:  VITAL SIGNS: ED Triage Vitals  Enc Vitals Group     BP 04/21/18 1328 (!) 150/75     Pulse Rate 04/21/18 1328 (!) 103     Resp 04/21/18 1328 16     Temp 04/21/18 1328 98.1 F (36.7 C)     Temp Source 04/21/18 1328 Oral     SpO2 04/21/18 1328 99 %     Weight 04/21/18 1329 235 lb (106.6 kg)     Height 04/21/18 1329 _0  (1.575 m)     Head Circumference --      Peak Flow --      Pain Score 04/21/18 1328 8     Pain Loc --        Pain Edu? --      Excl. in [place]? --     Constitutional: Alert and oriented. Well appearing and in no acute distress. Eyes: Conjunctivae are normal.  Head: Atraumatic. Nose: No congestion/rhinnorhea. Mouth/Throat: Mucous membranes are moist.   Neck:  supple no lymphadenopathy noted Cardiovascular: Normal rate, regular rhythm. Respiratory: Normal respiratory effort.  No retractions GU: External exam of the vagina does not show a noted abscess, there is a small ingrown hair but no "knot" is noted. Musculoskeletal: FROM all extremities, warm and well perfused Neurologic:  Normal speech and language.  Skin:  Skin is warm, dry and intact. No rash noted. Psychiatric: Mood and affect are normal. Speech and behavior are normal.  ____________________________________________   LABS (all labs ordered are listed, but only abnormal results are displayed)  Labs Reviewed - No data to display ____________________________________________   ____________________________________________  RADIOLOGY    ____________________________________________   PROCEDURES  Procedure(s) performed: No  Procedures    ____________________________________________   INITIAL IMPRESSION / ASSESSMENT AND PLAN / ED COURSE  Pertinent labs & imaging results that were available during my care of the patient were reviewed by me and considered in my medical decision making (see chart for details).   Patient is a 33 year old female presents emergency department complaining of a knot on her.  Physical exam shows a small ingrown hair but no large abscess.  Discussed the findings with patient.  She was given a prescription for Keflex.  She is to return to emergency department worsening.  Suggested warm water.  She states she understands will comply.  She is discharged stable condition.     As part of my medical decision making, I reviewed the following data within the [place] notes  reviewed and incorporated, Old chart reviewed, Notes from prior ED visits and [place] Controlled Substance Database  ____________________________________________   FINAL CLINICAL IMPRESSION(S) / ED DIAGNOSES  Final diagnoses:  Ingrown hair  Vaginal pain      NEW MEDICATIONS STARTED DURING THIS VISIT:  Discharge Medication List as of 04/21/2018  2:20 PM    START taking these medications   Details  cephALEXin (KEFLEX) 500 MG capsule Take 1 capsule (500 mg total) by mouth 3 (three) times daily., Starting Sat 04/21/2018, Normal    fluconazole (DIFLUCAN) 150 MG tablet Take one now and one in a week, Normal         Note:  This document was prepared using Dragon voice recognition software and may include unintentional dictation errors.    Versie Starks, PA-C 04/21/18 1831    Nena Polio, MD 04/22/18 [phone]

## 2018-04-21 NOTE — ED Notes (Signed)
Pt presents today for abscess in the groin area. Pt states pain is 8/10. Pt is ambulatory and NAD.

## 2018-11-08 ENCOUNTER — Emergency Department: Payer: Medicaid Other

## 2018-11-08 ENCOUNTER — Emergency Department
Admission: EM | Admit: 2018-11-08 | Discharge: 2018-11-09 | Disposition: A | Discharge status: Home / Self Care | Payer: Medicaid Other | Attending: Emergency Medicine | Admitting: Emergency Medicine

## 2018-11-08 ENCOUNTER — Encounter: Payer: Self-pay | Admitting: Radiology

## 2018-11-08 ENCOUNTER — Other Ambulatory Visit: Payer: Self-pay

## 2018-11-08 DIAGNOSIS — Z79899 Other long term (current) drug therapy: Secondary | ICD-10-CM | POA: Insufficient documentation

## 2018-11-08 DIAGNOSIS — F17210 Nicotine dependence, cigarettes, uncomplicated: Secondary | ICD-10-CM | POA: Diagnosis not present

## 2018-11-08 DIAGNOSIS — R911 Solitary pulmonary nodule: Secondary | ICD-10-CM | POA: Insufficient documentation

## 2018-11-08 DIAGNOSIS — R079 Chest pain, unspecified: Secondary | ICD-10-CM

## 2018-11-08 DIAGNOSIS — E119 Type 2 diabetes mellitus without complications: Secondary | ICD-10-CM | POA: Diagnosis not present

## 2018-11-08 DIAGNOSIS — R0789 Other chest pain: Secondary | ICD-10-CM | POA: Diagnosis present

## 2018-11-08 DIAGNOSIS — Z7984 Long term (current) use of oral hypoglycemic drugs: Secondary | ICD-10-CM | POA: Diagnosis not present

## 2018-11-08 DIAGNOSIS — E1165 Type 2 diabetes mellitus with hyperglycemia: Secondary | ICD-10-CM

## 2018-11-08 DIAGNOSIS — Z20828 Contact with and (suspected) exposure to other viral communicable diseases: Secondary | ICD-10-CM | POA: Diagnosis not present

## 2018-11-08 DIAGNOSIS — I509 Heart failure, unspecified: Secondary | ICD-10-CM | POA: Insufficient documentation

## 2018-11-08 DIAGNOSIS — I110 Hypertensive heart disease with heart failure: Secondary | ICD-10-CM | POA: Insufficient documentation

## 2018-11-08 LAB — COMPREHENSIVE METABOLIC PANEL
ALT: 14 U/L (ref 0–44)
AST: 17 U/L (ref 15–41)
Albumin: 3.6 g/dL (ref 3.5–5.0)
Alkaline Phosphatase: 104 U/L (ref 38–126)
Anion gap: 11 (ref 5–15)
BUN: 9 mg/dL (ref 6–20)
CO2: 22 mmol/L (ref 22–32)
Calcium: 9.4 mg/dL (ref 8.9–10.3)
Chloride: 100 mmol/L (ref 98–111)
Creatinine, Ser: 0.73 mg/dL (ref 0.44–1.00)
GFR calc Af Amer: >60 mL/min (ref >60)
GFR calc non Af Amer: >60 mL/min (ref >60)
Glucose, Bld: 465 mg/dL — ABNORMAL HIGH (ref 70–99)
Potassium: 3.7 mmol/L (ref 3.5–5.1)
Sodium: 133 mmol/L — ABNORMAL LOW (ref 135–145)
Total Bilirubin: 0.3 mg/dL (ref 0.3–1.2)
Total Protein: 8.1 g/dL (ref 6.5–8.1)

## 2018-11-08 LAB — CBC
HCT: 38.7 % (ref 36.0–46.0)
Hemoglobin: 12.7 g/dL (ref 12.0–15.0)
MCH: 27.9 pg (ref 26.0–34.0)
MCHC: 32.8 g/dL (ref 30.0–36.0)
MCV: 84.9 fL (ref 80.0–100.0)
Platelets: 270 10*3/uL (ref 150–400)
RBC: 4.56 MIL/uL (ref 3.87–5.11)
RDW: 12.2 % (ref 11.5–15.5)
WBC: 7.5 10*3/uL (ref 4.0–10.5)
nRBC: 0 % (ref 0.0–0.2)

## 2018-11-08 LAB — GLUCOSE, CAPILLARY: Glucose-Capillary: 408 mg/dL — ABNORMAL HIGH (ref 70–99)

## 2018-11-08 LAB — TROPONIN I (HIGH SENSITIVITY): Troponin I (High Sensitivity): 3 ng/L (ref <18)

## 2018-11-08 MED ORDER — IOHEXOL 350 MG/ML SOLN
100.0000 mL | Freq: Once | INTRAVENOUS | Status: AC | PRN
  Administered 2018-11-09: 100 mL via INTRAVENOUS

## 2018-11-08 NOTE — ED Triage Notes (Signed)
Pt in with co mid chest pain for 4 days states it radiates to back when she takes a deep breath. Hx of the same but unsure of dx. She said her chest pain could have been due to "bad UTI". PT states does feel shob at this time, none noted.

## 2018-11-08 NOTE — ED Provider Notes (Signed)
[facility] Emergency Department Provider Note    First MD Initiated Contact with Patient 11/08/18 2329     (approximate)  I have reviewed the triage vital signs and the nursing notes.   HISTORY  Chief Complaint Chest Pain   HPI [name] Wolf is a 33 y.o. female   with history of hypertension diabetes and congestive heart failure presents emergency department secondary to 4-day history of central chest pain with radiation to the back.  Patient also admits to dyspnea.  Patient also admits to worsening pain with deep inspiration.  Patient denies any lower extremity pain or swelling.  No personal familial history of DVT or PE.  No personal familial history of MI.  Patient admits to be noncompliant with anti-glycemic medications for "a while".       Past Medical History:  Diagnosis Date  . CHF (congestive heart failure) ([place])   . Gestational diabetes   . Hx of gallstones 69ys old   LAP removal of stones  . Hypertension   . Obesity, Class III, BMI 40-49.9 (morbid obesity) ([place])   . Pre-diabetes     Patient Active Problem List   Diagnosis Date Noted  . HTN (hypertension) 06/06/2017  . Diabetes ([place]) 06/06/2017  . CHF (congestive heart failure) ([place]) 05/17/2017  . Anemia of pregnancy 10/12/2015  . Family history of congenital anomalies   . Family or maternal historic risk of congenital anomaly 04/10/2015  . Tobacco abuse 04/10/2015  . Genetic screening 04/10/2015  . Rubella non-immune status, antepartum 04/10/2015  . Acanthosis nigricans 05/08/2013  . Acid reflux 05/08/2013  . Snores 05/08/2013    Past Surgical History:  Procedure Laterality Date  . LAPAROSCOPY  33 yo   gallstones removed    Prior to Admission medications   Medication Sig Start Date End Date Taking? Authorizing Provider  azithromycin (ZITHROMAX) 500 MG tablet Take 1 tablet (500 mg total) by mouth daily for 3 days. Take 1 tablet daily for 3 days. 11/09/18 11/12/18  Gregor Hams, MD  azithromycin (ZITHROMAX) 500 MG tablet Take 1 tablet (500 mg total) by mouth daily for 3 days. Take 1 tablet daily for 3 days. 11/09/18 11/12/18  Gregor Hams, MD  blood glucose meter kit and supplies KIT Dispense based on patient and insurance preference. Use up to four times daily as directed. (FOR ICD-9 250.00, 250.01). 05/16/17   Henreitta Leber, MD  cephALEXin (KEFLEX) 500 MG capsule Take 1 capsule (500 mg total) by mouth 3 (three) times daily. 04/21/18   Caryn Section Linden Dolin, PA-C  cyclobenzaprine (FLEXERIL) 5 MG tablet Take 1-2 tablets 3 times daily as needed 10/10/17   Laban Emperor, PA-C  fluconazole (DIFLUCAN) 150 MG tablet Take one now and one in a week 04/21/18   Caryn Section, Linden Dolin, PA-C  glimepiride (AMARYL) 1 MG tablet Take 1 tablet (1 mg total) by mouth daily with breakfast. 11/09/18 02/07/19  Gregor Hams, MD  ibuprofen (ADVIL,MOTRIN) 600 MG tablet Take 1 tablet (600 mg total) by mouth every 6 (six) hours as needed. 10/10/17   Laban Emperor, PA-C  lidocaine (LIDODERM) 5 % Place 1 patch onto the skin daily. Remove & Discard patch within 12 hours or as directed by MD 10/10/17   Laban Emperor, PA-C  metFORMIN (GLUCOPHAGE) 500 MG tablet Take 1 tablet (500 mg total) by mouth 2 (two) times daily with a meal. 11/09/18 02/07/19  Gregor Hams, MD    Allergies Patient has no known allergies.  Family  History  Problem Relation Age of Onset  . Hypertension Mother   . Diabetes Sister   . Migraines Sister     Social History Social History   Tobacco Use  . Smoking status: Current Some Day Smoker    Packs/day: 1.00    Types: Cigarettes  . Smokeless tobacco: Never Used  Substance Use Topics  . Alcohol use: No    Alcohol/week: 0.0 standard drinks  . Drug use: No    Review of Systems Constitutional: No fever/chills Eyes: No visual changes. ENT: No sore throat. Cardiovascular: Positive for chest pain. Respiratory: Positive for shortness of breath.  Gastrointestinal: No abdominal pain.  No nausea, no vomiting.  No diarrhea.  No constipation. Genitourinary: Negative for dysuria. Musculoskeletal: Negative for neck pain.  Negative for back pain. Integumentary: Negative for rash. Neurological: Negative for headaches, focal weakness or numbness.   ____________________________________________   PHYSICAL EXAM:  VITAL SIGNS: ED Triage Vitals [11/08/18 2103]  Enc Vitals Group     BP      Pulse      Resp      Temp      Temp src      SpO2      Weight 98.9 kg (218 lb)     Height 1.6 m (_0 )     Head Circumference      Peak Flow      Pain Score 7     Pain Loc      Pain Edu?      Excl. in [place]?     Constitutional: Alert and oriented.  Eyes: Conjunctivae are normal.  Mouth/Throat: Mucous membranes are moist. Neck: No stridor.  No meningeal signs.   Cardiovascular: Normal rate, regular rhythm. Good peripheral circulation. Grossly normal heart sounds. Respiratory: Normal respiratory effort.  No retractions. Gastrointestinal: Soft and nontender. No distention.  Musculoskeletal: No lower extremity tenderness nor edema. No gross deformities of extremities. Neurologic:  Normal speech and language. No gross focal neurologic deficits are appreciated.  Skin:  Skin is warm, dry and intact. Psychiatric: Mood and affect are normal. Speech and behavior are normal.  ____________________________________________   LABS (all labs ordered are listed, but only abnormal results are displayed)  Labs Reviewed  COMPREHENSIVE METABOLIC PANEL - Abnormal; Notable for the following components:      Result Value   Sodium 133 (*)    Glucose, Bld 465 (*)    All other components within normal limits  GLUCOSE, CAPILLARY - Abnormal; Notable for the following components:   Glucose-Capillary 408 (*)    All other components within normal limits  GLUCOSE, CAPILLARY - Abnormal; Notable for the following components:   Glucose-Capillary 266 (*)    All  other components within normal limits  SARS CORONAVIRUS 2 (HOSPITAL ORDER, [place] [facility])  CBC  TROPONIN I (HIGH SENSITIVITY)  TROPONIN I (HIGH SENSITIVITY)   ____________________________________________  EKG  ED ECG REPORT I, [place] N Arnez Stoneking, the attending physician, personally viewed and interpreted this ECG.   Date: 11/08/2018  EKG Time: 9:04 PM  Rate: 107  Rhythm: Sinus tachycardia  Axis: Normal  Intervals: Normal  ST&T Change: None  ____________________________________________  RADIOLOGY I, [place] N Jeremia Groot, personally viewed and evaluated these images (plain radiographs) as part of my medical decision making, as well as reviewing the written report by the radiologist.  ED MD interpretation: No acute abnormality noted on chest x-ray per radiologist.  T angiogram of the chest revealed no evidence of PE however  multifocal areas of subpleural nodular opacities noted throughout the left upper right upper middle and lower lobes with additional findings as listed below.  Official radiology report(s): Dg Chest 2 View  Result Date: 11/08/2018 CLINICAL DATA:  Chest pain for several days EXAM: CHEST - 2 VIEW COMPARISON:  05/17/2017 CT FINDINGS: Cardiac shadows within normal limits. The lungs are well aerated bilaterally. No focal infiltrate or sizable effusion is seen. Some mild scarring in the lingula is again noted stable from the prior CT. No bony abnormality is noted. IMPRESSION: No acute abnormality noted. Electronically Signed   By: Inez Catalina M.D.   On: 11/08/2018 22:01   Ct Angio Chest Pe W And/or Wo Contrast  Result Date: 11/09/2018 CLINICAL DATA:  Mid chest pain for 4 days, radiating to back with deep inspiration EXAM: CT ANGIOGRAPHY CHEST WITH CONTRAST TECHNIQUE: Multidetector CT imaging of the chest was performed using the standard protocol during bolus administration of intravenous contrast. Multiplanar CT image reconstructions and MIPs were  obtained to evaluate the vascular anatomy. CONTRAST:  157m OMNIPAQUE IOHEXOL 350 MG/ML SOLN COMPARISON:  CT 05/17/2017 FINDINGS: Cardiovascular: Preferential, satisfactory opacification of the pulmonary arteries to the segmental level. No central, lobar or segmental pulmonary arterial filling defects are identified. No central pulmonary arterial enlargement. No elevation of the RV/LV ratio (0.75). Fair opacification of the thoracic aorta. The aortic root is suboptimally assessed given cardiac pulsation artifact. No luminal irregularity of the thoracic aorta is seen. Normal 3 vessel branching pattern. Normal heart size. No pericardial effusion. Mediastinum/Nodes: Scattered prominent but nonenlarged low-attenuation lymph nodes throughout the mediastinum and hilar likely reactive. No axillary adenopathy. No acute abnormality of the trachea or esophagus. Thyroid gland and thoracic inlet are unremarkable. Lungs/Pleura: There are multifocal areas of subpleural nodular opacity throughout the left upper, right upper middle and lower lobes. Region of scarring and traction bronchiectasis is present in the medial right middle lobe. Additional bandlike areas of linear opacity in the lung bases and lingula reflect additional sites of scarring or atelectasis. No pneumothorax. No visible effusion. Upper Abdomen: No acute abnormalities present in the visualized portions of the upper abdomen. Patient is post cholecystectomy. Musculoskeletal: No acute osseous abnormality or suspicious osseous lesion. Review of the MIP images confirms the above findings. IMPRESSION: 1. No evidence of pulmonary embolism. 2. Grossly normal appearance of the aorta on this non tailored examination. 3. Multifocal areas of subpleural nodular opacity throughout the left upper, right upper middle, and lower lobes, with mild airways thickening is favored to be infectious or inflammatory in etiology including potential atypical viral pneumonia. 4. Region of  scarring and traction bronchiectasis in the medial right middle lobe, likely related to prior infection/inflammation. 5. Prominent but nonenlarged low-attenuation lymph nodes throughout the mediastinum and hilar regions, likely reactive. Electronically Signed   By: PLovena LeM.D.   On: 11/09/2018 00:17    ____________________________________________   PROCEDURES   Procedure(s) performed (including Critical Care):  Procedures   ____________________________________________   INITIAL IMPRESSION / MDM / [place]/ ED COURSE  As part of my medical decision making, I reviewed the following data within the electronic MEDICAL RECORD NUMBER   33year old female presented with above-stated history and physical exam secondary to chest pain dyspnea.  Considered possibly of CAD/MI and a such EKG was performed which revealed no evidence of ischemia or infarction.  High-sensitivity troponin also negative x2.  I considered possibility of pulmonary etiology.  Patient's CT scan from March 2019 reviewed which revealed multiple  areas of nodular opacities with findings on the CT performed today consistent with that as well.  Question if this may be secondary to Sarcoidosis as such patient will be referred to pulmonology Dr. Raul Del for further outpatient evaluation.  Regarding patient's hyperglycemia patient was given 8 units of IV insulin in the emergency department with resulting glucose of 266 obtained.  Patient will be prescribed metformin and glimepiride which she states that she ran out of at home  ____________________________________________  FINAL CLINICAL IMPRESSION(S) / ED DIAGNOSES  Final diagnoses:  Nonspecific chest pain  Uncontrolled type 2 diabetes mellitus with hyperglycemia (HCC)  Pulmonary nodule     MEDICATIONS GIVEN DURING THIS VISIT:  Medications  iohexol (OMNIPAQUE) 350 MG/ML injection 100 mL (100 mLs Intravenous Contrast Given 11/09/18 0002)  insulin aspart (novoLOG)  injection 8 Units (8 Units Intravenous Given 11/09/18 0028)  aspirin chewable tablet 324 mg (324 mg Oral Given 11/09/18 0028)     ED Discharge Orders         Ordered    metFORMIN (GLUCOPHAGE) 500 MG tablet  2 times daily with meals     11/09/18 0147    glimepiride (AMARYL) 1 MG tablet  Daily with breakfast     11/09/18 0147    azithromycin (ZITHROMAX) 500 MG tablet  Daily,   Status:  Discontinued     11/09/18 0148    azithromycin (ZITHROMAX) 500 MG tablet  Daily     11/09/18 0203    azithromycin (ZITHROMAX) 500 MG tablet  Daily     11/09/18 0203          *Please note:  Calli Bashor was evaluated in Emergency Department on 11/09/2018 for the symptoms described in the history of present illness. She was evaluated in the context of the global COVID-19 pandemic, which necessitated consideration that the patient might be at risk for infection with the SARS-CoV-2 virus that causes COVID-19. Institutional protocols and algorithms that pertain to the evaluation of patients at risk for COVID-19 are in a state of rapid change based on information released by regulatory bodies including the CDC and federal and state organizations. These policies and algorithms were followed during the patient's care in the ED.  Some ED evaluations and interventions may be delayed as a result of limited staffing during the pandemic.*  Note:  This document was prepared using Dragon voice recognition software and may include unintentional dictation errors.   Gregor Hams, MD 11/09/18 [id number]

## 2018-11-09 LAB — TROPONIN I (HIGH SENSITIVITY): Troponin I (High Sensitivity): 2 ng/L (ref <18)

## 2018-11-09 LAB — GLUCOSE, CAPILLARY: Glucose-Capillary: 266 mg/dL — ABNORMAL HIGH (ref 70–99)

## 2018-11-09 LAB — SARS CORONAVIRUS 2 BY RT PCR (HOSPITAL ORDER, PERFORMED IN ~~LOC~~ HOSPITAL LAB): SARS Coronavirus 2: NEGATIVE

## 2018-11-09 MED ORDER — AZITHROMYCIN 500 MG PO TABS: 500.0000 mg | ORAL_TABLET | Freq: Every day | ORAL | 0 refills | Status: AC

## 2018-11-09 MED ORDER — AZITHROMYCIN 500 MG PO TABS: 500.0000 mg | ORAL_TABLET | Freq: Every day | ORAL | 0 refills | Status: DC

## 2018-11-09 MED ORDER — ASPIRIN 81 MG PO CHEW
324.0000 mg | CHEWABLE_TABLET | Freq: Once | ORAL | Status: AC
  Administered 2018-11-09: 324 mg via ORAL
  Filled 2018-11-09: qty 4

## 2018-11-09 MED ORDER — INSULIN ASPART 100 UNIT/ML ~~LOC~~ SOLN
8.0000 [IU] | Freq: Once | SUBCUTANEOUS | Status: AC
  Administered 2018-11-09: 8 [IU] via INTRAVENOUS
  Filled 2018-11-09: qty 1

## 2018-11-09 MED ORDER — METFORMIN HCL 500 MG PO TABS: 500.0000 mg | ORAL_TABLET | Freq: Two times a day (BID) | ORAL | 2 refills | Status: DC

## 2018-11-09 MED ORDER — GLIMEPIRIDE 1 MG PO TABS: 1.0000 mg | ORAL_TABLET | Freq: Every day | ORAL | 2 refills | Status: DC

## 2019-04-30 IMAGING — CT CT RENAL STONE PROTOCOL
2 of 4 series · 16 of 46 positions shown, 18 images · non-contrast
Comparison: None.

CLINICAL DATA: Nausea, vomiting and diarrhea.

EXAM:
CT ABDOMEN AND PELVIS WITHOUT CONTRAST
TECHNIQUE: Multidetector CT imaging of the abdomen and pelvis was performed
following the standard protocol without IV contrast.

[Series 2: stone full standard · axial · 0.81mm/px · z∈[-280,+175]mm · 13 of 101 slices shown, 15 images]
[im 5/101  soft-tissue]
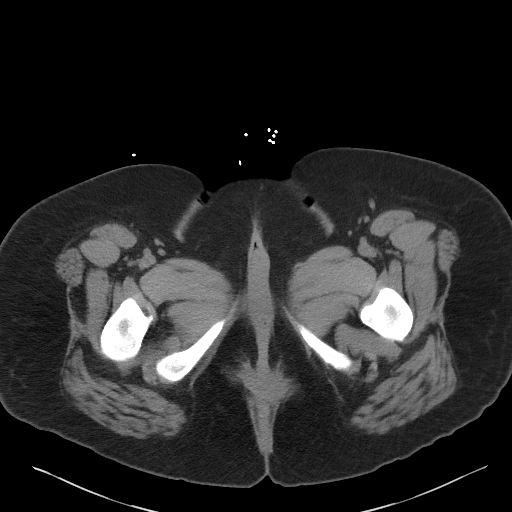
[im 5/101  bone]
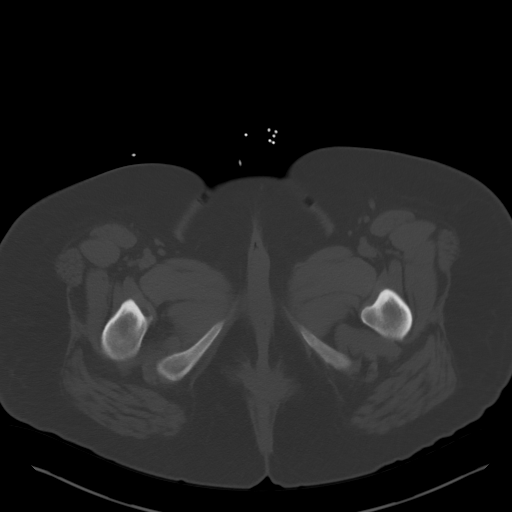
[im 14/101  soft-tissue]
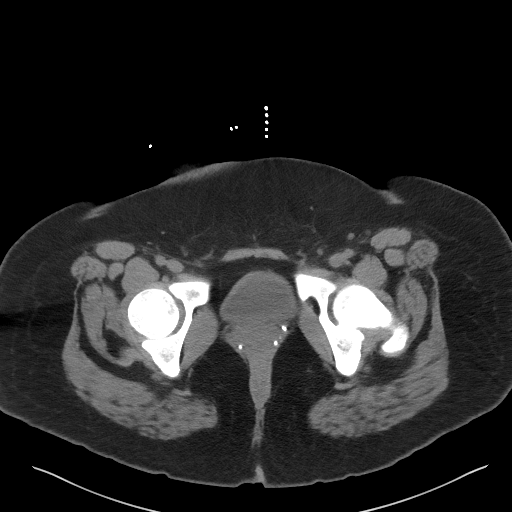
[im 22/101  soft-tissue]
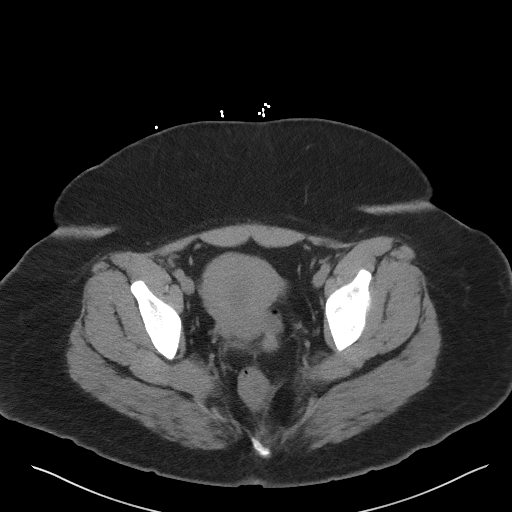
[im 27/101  soft-tissue]
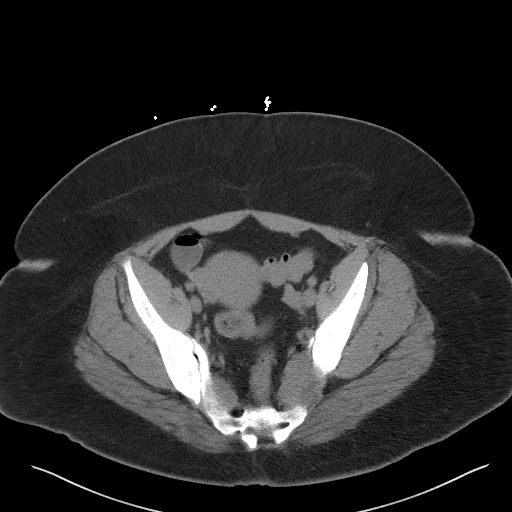
[im 35/101  soft-tissue]
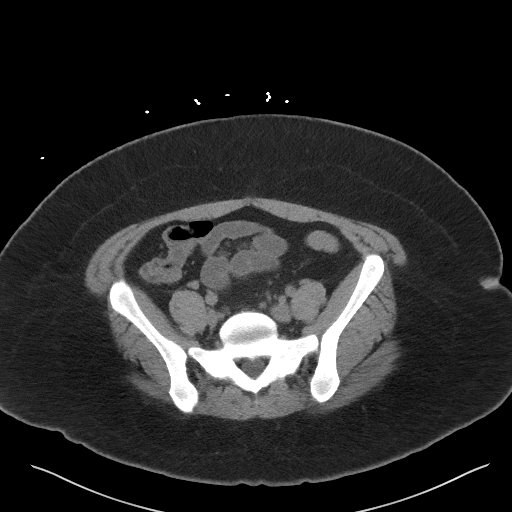
[im 44/101  soft-tissue]
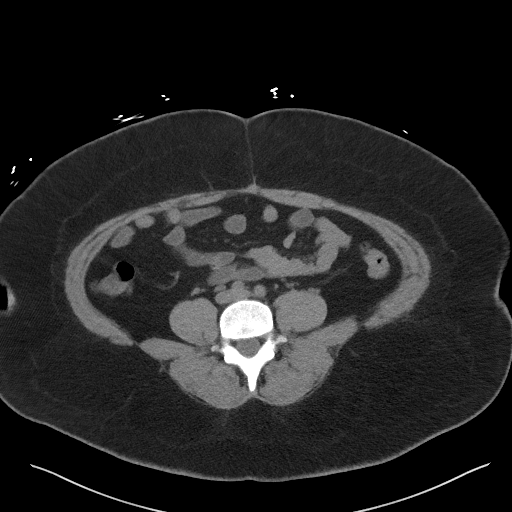
[im 53/101  soft-tissue]
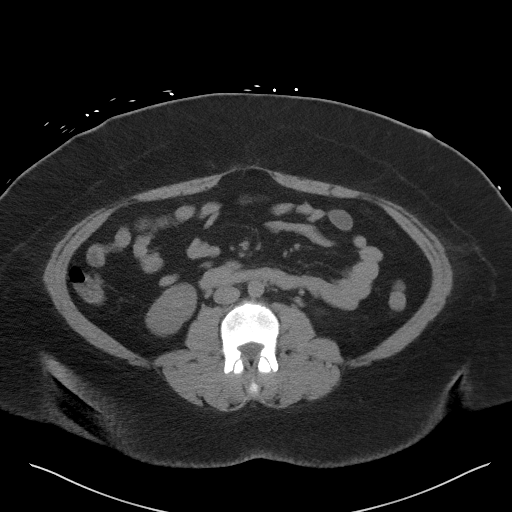
[im 57/101  soft-tissue]
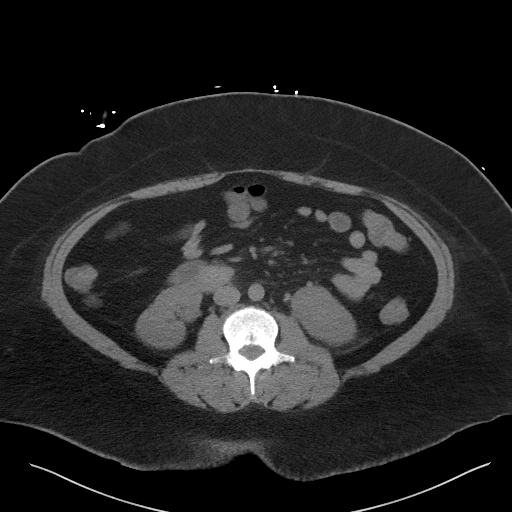
[im 66/101  soft-tissue]
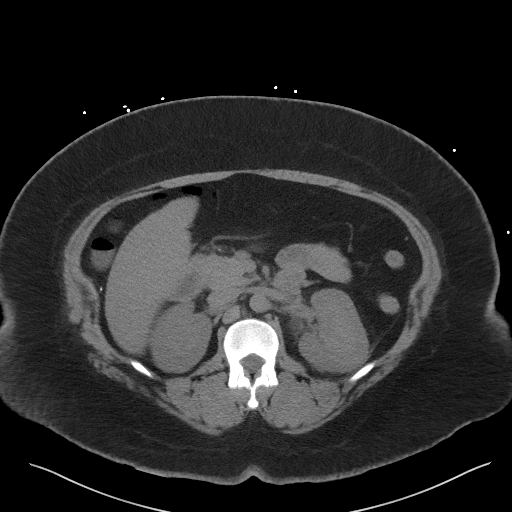
[im 66/101  bone]
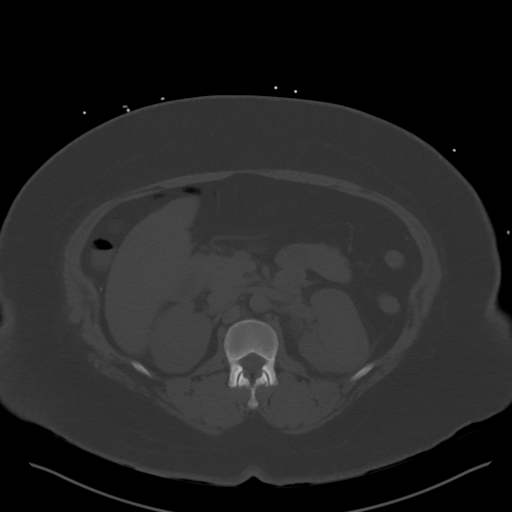
[im 74/101  soft-tissue]
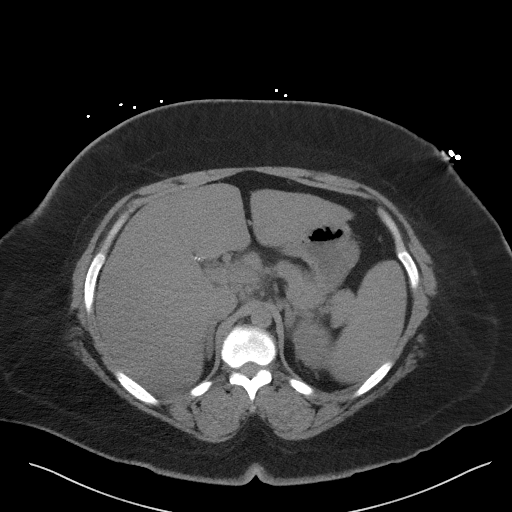
[im 79/101  soft-tissue]
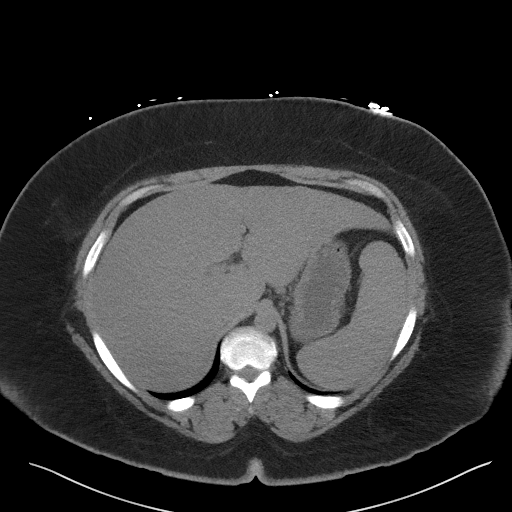
[im 87/101  soft-tissue]
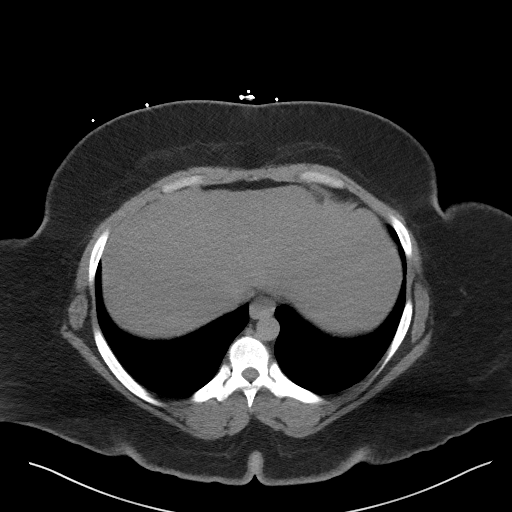
[im 96/101  soft-tissue]
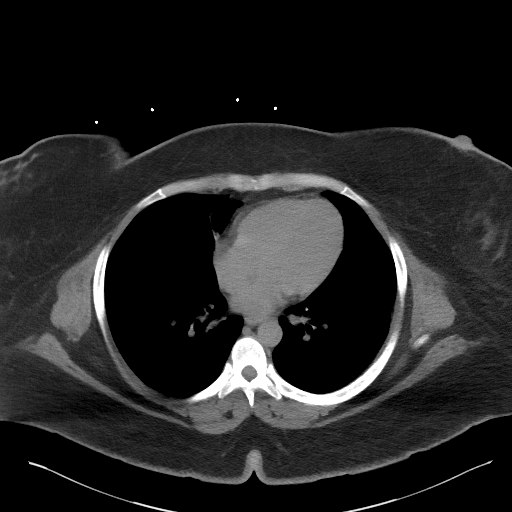

[Series 5: coronal · coronal · 0.79mm/px · 3 of 150 slices shown]
[im 50/150  soft-tissue]
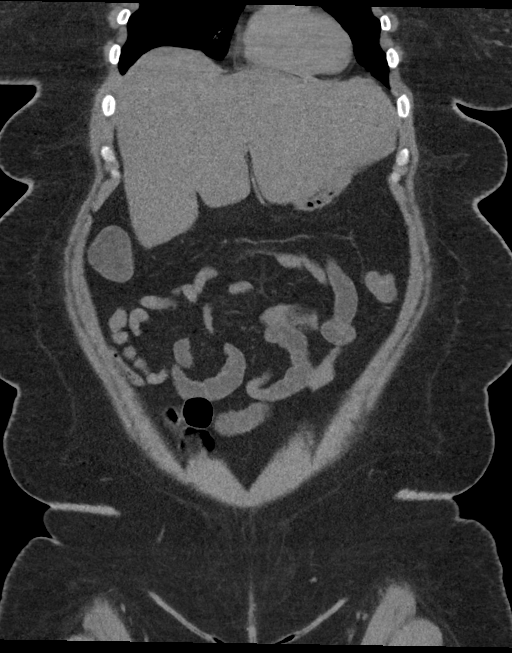
[im 67/150  soft-tissue]
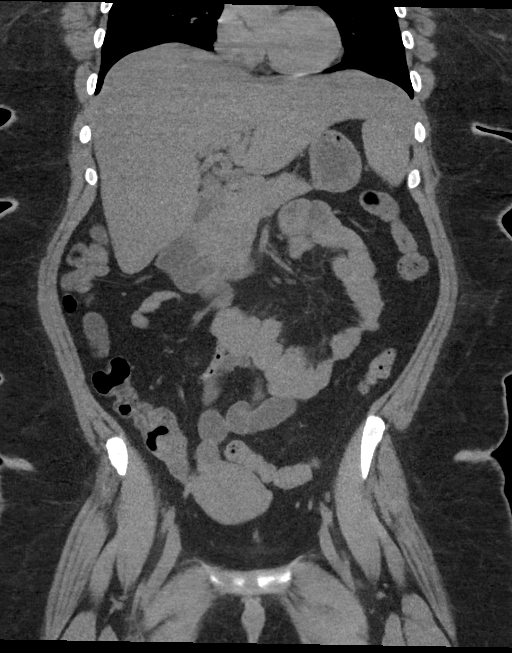
[im 83/150  soft-tissue]
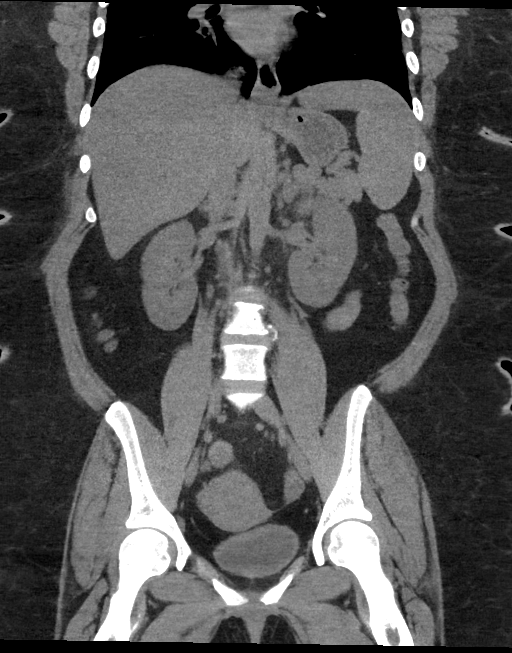

[16 of 46 positions shown; findings below may reference images not displayed]

FINDINGS: Lower chest: Minimal linear atelectasis over the right middle lobe.
Subcentimeter lymph node over the right pericardiophrenic region.

Hepatobiliary: Previous cholecystectomy. Liver and biliary tree are
normal.

Pancreas: Normal.

Spleen: Normal.

Adrenals/Urinary Tract: Adrenal glands are normal. Kidneys normal in
size without hydronephrosis or nephrolithiasis. Ureters and bladder
are normal.

Stomach/Bowel: Stomach and small bowel are normal. Appendix is
normal. Colon is within normal.

Vascular/Lymphatic: Normal.

Reproductive: Uterus just right of midline.  Ovaries unremarkable.

Other: No free fluid or focal inflammatory change.

Musculoskeletal: Within normal.
IMPRESSION: No acute findings in the abdomen/pelvis.

## 2019-06-02 ENCOUNTER — Emergency Department: Payer: Medicaid Other

## 2019-06-02 ENCOUNTER — Encounter: Payer: Self-pay | Admitting: Emergency Medicine

## 2019-06-02 ENCOUNTER — Emergency Department
Admission: EM | Admit: 2019-06-02 | Discharge: 2019-06-02 | Disposition: A | Discharge status: Home / Self Care | Payer: Medicaid Other | Attending: Emergency Medicine | Admitting: Emergency Medicine

## 2019-06-02 ENCOUNTER — Other Ambulatory Visit: Payer: Self-pay

## 2019-06-02 DIAGNOSIS — I509 Heart failure, unspecified: Secondary | ICD-10-CM | POA: Insufficient documentation

## 2019-06-02 DIAGNOSIS — Z79899 Other long term (current) drug therapy: Secondary | ICD-10-CM | POA: Diagnosis not present

## 2019-06-02 DIAGNOSIS — E119 Type 2 diabetes mellitus without complications: Secondary | ICD-10-CM | POA: Insufficient documentation

## 2019-06-02 DIAGNOSIS — Z7984 Long term (current) use of oral hypoglycemic drugs: Secondary | ICD-10-CM | POA: Diagnosis not present

## 2019-06-02 DIAGNOSIS — I110 Hypertensive heart disease with heart failure: Secondary | ICD-10-CM | POA: Diagnosis not present

## 2019-06-02 DIAGNOSIS — K6289 Other specified diseases of anus and rectum: Secondary | ICD-10-CM | POA: Diagnosis not present

## 2019-06-02 DIAGNOSIS — N762 Acute vulvitis: Secondary | ICD-10-CM

## 2019-06-02 DIAGNOSIS — F17210 Nicotine dependence, cigarettes, uncomplicated: Secondary | ICD-10-CM | POA: Diagnosis not present

## 2019-06-02 DIAGNOSIS — K611 Rectal abscess: Secondary | ICD-10-CM | POA: Diagnosis not present

## 2019-06-02 LAB — CBC WITH DIFFERENTIAL/PLATELET
Abs Immature Granulocytes: 0.02 10*3/uL (ref 0.00–0.07)
Basophils Absolute: 0.1 10*3/uL (ref 0.0–0.1)
Basophils Relative: 1 %
Eosinophils Absolute: 0.1 10*3/uL (ref 0.0–0.5)
Eosinophils Relative: 1 %
HCT: 37.1 % (ref 36.0–46.0)
Hemoglobin: 11.9 g/dL — ABNORMAL LOW (ref 12.0–15.0)
Immature Granulocytes: 0 %
Lymphocytes Relative: 28 %
Lymphs Abs: 2 10*3/uL (ref 0.7–4.0)
MCH: 27.3 pg (ref 26.0–34.0)
MCHC: 32.1 g/dL (ref 30.0–36.0)
MCV: 85.1 fL (ref 80.0–100.0)
Monocytes Absolute: 0.5 10*3/uL (ref 0.1–1.0)
Monocytes Relative: 7 %
Neutro Abs: 4.6 10*3/uL (ref 1.7–7.7)
Neutrophils Relative %: 63 %
Platelets: 211 10*3/uL (ref 150–400)
RBC: 4.36 MIL/uL (ref 3.87–5.11)
RDW: 14.2 % (ref 11.5–15.5)
WBC: 7.2 10*3/uL (ref 4.0–10.5)
nRBC: 0 % (ref 0.0–0.2)

## 2019-06-02 LAB — COMPREHENSIVE METABOLIC PANEL
ALT: 16 U/L (ref 0–44)
AST: 17 U/L (ref 15–41)
Albumin: 3.1 g/dL — ABNORMAL LOW (ref 3.5–5.0)
Alkaline Phosphatase: 96 U/L (ref 38–126)
Anion gap: 9 (ref 5–15)
BUN: 9 mg/dL (ref 6–20)
CO2: 27 mmol/L (ref 22–32)
Calcium: 9.1 mg/dL (ref 8.9–10.3)
Chloride: 97 mmol/L — ABNORMAL LOW (ref 98–111)
Creatinine, Ser: 0.7 mg/dL (ref 0.44–1.00)
GFR calc Af Amer: >60 mL/min (ref >60)
GFR calc non Af Amer: >60 mL/min (ref >60)
Glucose, Bld: 378 mg/dL — ABNORMAL HIGH (ref 70–99)
Potassium: 3.6 mmol/L (ref 3.5–5.1)
Sodium: 133 mmol/L — ABNORMAL LOW (ref 135–145)
Total Bilirubin: 0.8 mg/dL (ref 0.3–1.2)
Total Protein: 7.4 g/dL (ref 6.5–8.1)

## 2019-06-02 LAB — URINALYSIS, COMPLETE (UACMP) WITH MICROSCOPIC
Bilirubin Urine: NEGATIVE
Glucose, UA: >=500 mg/dL — AB
Hgb urine dipstick: NEGATIVE
Ketones, ur: 80 mg/dL — AB
Leukocytes,Ua: NEGATIVE
Nitrite: NEGATIVE
Protein, ur: NEGATIVE mg/dL
Specific Gravity, Urine: 1.038 — ABNORMAL HIGH (ref 1.005–1.030)
pH: 5 (ref 5.0–8.0)

## 2019-06-02 LAB — GLUCOSE, CAPILLARY: Glucose-Capillary: 225 mg/dL — ABNORMAL HIGH (ref 70–99)

## 2019-06-02 LAB — POCT PREGNANCY, URINE: Preg Test, Ur: NEGATIVE

## 2019-06-02 LAB — LACTIC ACID, PLASMA: Lactic Acid, Venous: 1.2 mmol/L (ref 0.5–1.9)

## 2019-06-02 MED ORDER — FENTANYL CITRATE (PF) 100 MCG/2ML IJ SOLN
50.0000 ug | Freq: Once | INTRAMUSCULAR | Status: AC
  Administered 2019-06-02: 50 ug via INTRAVENOUS
  Filled 2019-06-02: qty 2

## 2019-06-02 MED ORDER — IOHEXOL 9 MG/ML PO SOLN
500.0000 mL | ORAL | Status: AC
  Administered 2019-06-02 (×2): 500 mL via ORAL
  Filled 2019-06-02 (×2): qty 500

## 2019-06-02 MED ORDER — OXYCODONE-ACETAMINOPHEN 5-325 MG PO TABS
1.0000 | ORAL_TABLET | Freq: Once | ORAL | Status: AC
  Administered 2019-06-02: 1 via ORAL
  Filled 2019-06-02: qty 1

## 2019-06-02 MED ORDER — IOHEXOL 300 MG/ML  SOLN
100.0000 mL | Freq: Once | INTRAMUSCULAR | Status: AC | PRN
  Administered 2019-06-02: 18:00:00 100 mL via INTRAVENOUS
  Filled 2019-06-02: qty 100

## 2019-06-02 MED ORDER — OXYCODONE-ACETAMINOPHEN 5-325 MG PO TABS
1.0000 | ORAL_TABLET | ORAL | Status: DC | PRN
  Administered 2019-06-02: 12:00:00 1 via ORAL
  Filled 2019-06-02: qty 1

## 2019-06-02 MED ORDER — OXYCODONE-ACETAMINOPHEN 5-325 MG PO TABS: 1.0000 | ORAL_TABLET | Freq: Four times a day (QID) | ORAL | 0 refills | Status: AC | PRN

## 2019-06-02 MED ORDER — IOHEXOL 9 MG/ML PO SOLN
500.0000 mL | Freq: Two times a day (BID) | ORAL | Status: DC | PRN
  Filled 2019-06-02: qty 500

## 2019-06-02 MED ORDER — FENTANYL CITRATE (PF) 100 MCG/2ML IJ SOLN
50.0000 ug | Freq: Once | INTRAMUSCULAR | Status: AC
  Administered 2019-06-02: 16:00:00 50 ug via INTRAVENOUS
  Filled 2019-06-02: qty 2

## 2019-06-02 MED ORDER — SODIUM CHLORIDE 0.9 % IV SOLN
3.0000 g | Freq: Once | INTRAVENOUS | Status: AC
  Filled 2019-06-02: qty 8

## 2019-06-02 MED ORDER — AMOXICILLIN 500 MG PO CAPS: 500.0000 mg | ORAL_CAPSULE | Freq: Three times a day (TID) | ORAL | 0 refills | Status: DC

## 2019-06-02 MED ORDER — SODIUM CHLORIDE 0.9 % IV BOLUS
1000.0000 mL | Freq: Once | INTRAVENOUS | Status: AC
  Administered 2019-06-02: 20:00:00 1000 mL via INTRAVENOUS

## 2019-06-02 NOTE — ED Provider Notes (Signed)
Assuming care from my colleague Bridget Hartshorn, PA-C, this patient is a female diabetic on oral medications with a history of hypertension, CHF, obesity, and acanthosis nigricans who presents with a 3-day complaint of rectal pain.  Patient reports previous history of skin abscesses, some requiring IV management.  She presents today however with acute pain to the rectal region difficulty sitting.  She denies any interim fevers, chills, sweats.  Physical Exam  BP (!) 153/97 (BP Location: Left Wrist)   Pulse 93   Temp 98.4 F (36.9 C) (Oral)   Resp 16   Ht 5\' 3"  (1.6 m)   Wt 90.7 kg   LMP 05/26/2019 (Within Days)   SpO2 99%   Breastfeeding No Comment: neg preg  BMI 35.43 kg/m   Physical Exam   GU: Patient with a area of inflammation noted to extend from the inferior vagina vulvar region into the anterior rectum.  The skin is inflamed and enlarged but no fluctuance is appreciated.  There is an inferior area of granulation tissue but no appreciable purulent drainage with manipulation.  ED Course/Procedures    RADIOLOGY  CT Pelvis w/ CM  IMPRESSION: 1. Inflammation of the inferior third of the vagina, adjacent pelvic floor, and posterior labia, right greater than left. No discrete abscess. No definitive involvement of the anus. ______________________________________________________  PROCEDURES  Procedures  NS 1000 ml bolus Ancef 3 g IVP Percocet 5-325 mg PO   MDM   The differential diagnosis for this patient included perirectal/perineal abscess, Fournier gangrene, vulvar abscess, Bartholin's gland abscess, and rectal prolapse.  ----------------------------------------- 7:19 PM on 06/02/2019 ----------------------------------------- S/W Dr. 08/02/2019: He reviewed the CT images and advised that surgical intervention admission was not warranted.  He did suggest IV antibiotics and consult with gynecology for outpatient management.  ---------------------------------------- 8:09 PM on  06/02/2019 ----------------------------------------- S/W Dr. 08/02/2019: He reviewed the chart and labs, and suggested patient be treated with IV Ancef.  He also advised outpatient oral antibiotics and will follow the patient in the outpatient setting for resolution.   Patient with ED evaluation of an abscess found to the vulvar and perineum.  No focal cellulitis was appreciated on CT scan imaging.  Clinically the patient had a normal white count, but blood sugars were elevated at 378.  Patient is a poorly controlled diabetic on medications. Patient was found on CT scan to have no appreciable, drainable abscess but cellulitis extending from the vulvar, vagina, and pelvic floor.  Patient is treated empirically with IV Ancef, and pain control while in the ED.  She was discharged with a plan for amoxicillin Percocet, and will follow up with GYN for further management.  Patient was given strict return precautions.  Also advised that she should manage her sugars more closely.  Patient verbalized understanding is discharged at this time.      Feliberto Gottron, PA-C 06/03/19 0029    08/03/19, MD 06/03/19 1504

## 2019-06-02 NOTE — Discharge Instructions (Addendum)
You are being treated for a vulvar/perineal cellulitis. Take the antibiotics as directed. Take the pain medicine as needed. You should closely monitor and control your blood sugars to prevent infections like this. Consider warm sitz bathtub soaks to promote healing. Return to the ED as needed. Follow-up with Dr. Feliberto Gottron next week for recheck.

## 2019-06-02 NOTE — ED Notes (Signed)
Pt tearful, reporting pain, provider notified. 50 mcg fentanyl given

## 2019-06-02 NOTE — ED Notes (Signed)
Pt ambulatory to restroom, steady gait, able to provide urine sample 

## 2019-06-02 NOTE — ED Notes (Signed)
D/w Dr. Roxan Hockey, no new orders,

## 2019-06-02 NOTE — ED Provider Notes (Signed)
[facility] Emergency Department Provider Note   ____________________________________________   First MD Initiated Contact with Patient 06/02/19 1309     (approximate)  I have reviewed the triage vital signs and the nursing notes.   HISTORY  Chief Complaint Rectal Pain   HPI Nakenya Theall is a 34 y.o. female presents to the ED with complaint of rectal pain for the last 3 days.  Patient states that pain is increased with sitting or "passing gas".  Patient denies any previous hemorrhoids or history of constipation.  She denies any black tarry stools or bright red blood.  Pain is unrelieved by over-the-counter medication.  Patient has a history of diabetes, hypertension for which she takes oral medication.  She states that her glucose usually runs in the 300s.  She also has not taken any medication in the last day.  Her pain as a 10/10.      Past Medical History:  Diagnosis Date  . CHF (congestive heart failure) ([place])   . Gestational diabetes   . Hx of gallstones 62ys old   LAP removal of stones  . Hypertension   . Obesity, Class III, BMI 40-49.9 (morbid obesity) ([place])   . Pre-diabetes     Patient Active Problem List   Diagnosis Date Noted  . HTN (hypertension) 06/06/2017  . Diabetes ([place]) 06/06/2017  . CHF (congestive heart failure) ([place]) 05/17/2017  . Anemia of pregnancy 10/12/2015  . Family history of congenital anomalies   . Family or maternal historic risk of congenital anomaly 04/10/2015  . Tobacco abuse 04/10/2015  . Genetic screening 04/10/2015  . Rubella non-immune status, antepartum 04/10/2015  . Acanthosis nigricans 05/08/2013  . Acid reflux 05/08/2013  . Snores 05/08/2013    Past Surgical History:  Procedure Laterality Date  . LAPAROSCOPY  34 yo   gallstones removed    Prior to Admission medications   Medication Sig Start Date End Date Taking? Authorizing Provider  blood glucose meter kit and supplies KIT Dispense based  on patient and insurance preference. Use up to four times daily as directed. (FOR ICD-9 250.00, 250.01). 05/16/17   Henreitta Leber, MD  cephALEXin (KEFLEX) 500 MG capsule Take 1 capsule (500 mg total) by mouth 3 (three) times daily. 04/21/18   Caryn Section Linden Dolin, PA-C  cyclobenzaprine (FLEXERIL) 5 MG tablet Take 1-2 tablets 3 times daily as needed 10/10/17   Laban Emperor, PA-C  fluconazole (DIFLUCAN) 150 MG tablet Take one now and one in a week 04/21/18   Caryn Section, Linden Dolin, PA-C  glimepiride (AMARYL) 1 MG tablet Take 1 tablet (1 mg total) by mouth daily with breakfast. 11/09/18 02/07/19  Gregor Hams, MD  ibuprofen (ADVIL,MOTRIN) 600 MG tablet Take 1 tablet (600 mg total) by mouth every 6 (six) hours as needed. 10/10/17   Laban Emperor, PA-C  lidocaine (LIDODERM) 5 % Place 1 patch onto the skin daily. Remove & Discard patch within 12 hours or as directed by MD 10/10/17   Laban Emperor, PA-C  metFORMIN (GLUCOPHAGE) 500 MG tablet Take 1 tablet (500 mg total) by mouth 2 (two) times daily with a meal. 11/09/18 02/07/19  Gregor Hams, MD    Allergies Patient has no known allergies.  Family History  Problem Relation Age of Onset  . Hypertension Mother   . Diabetes Sister   . Migraines Sister     Social History Social History   Tobacco Use  . Smoking status: Current Some Day Smoker    Packs/day: 0.25  Types: Cigarettes  . Smokeless tobacco: Never Used  Substance Use Topics  . Alcohol use: No    Alcohol/week: 0.0 standard drinks  . Drug use: No    Review of Systems Constitutional: No fever/chills Eyes: No visual changes.  Cardiovascular: Denies chest pain. Respiratory: Denies shortness of breath.  Negative for cough. Gastrointestinal: No abdominal pain.  No nausea, no vomiting.  No diarrhea.  No constipation.  Positive for rectal pain. Genitourinary: Negative for dysuria. Musculoskeletal: Negative for back pain. Skin: Negative for rash. Neurological: Negative for headaches,  focal weakness or numbness. ____________________________________________   PHYSICAL EXAM:  VITAL SIGNS: ED Triage Vitals [06/02/19 1127]  Enc Vitals Group     BP (!) 146/108     Pulse Rate (!) 106     Resp 18     Temp 98.4 F (36.9 C)     Temp Source Oral     SpO2 99 %     Weight 200 lb (90.7 kg)     Height 5' 3"  (1.6 m)     Head Circumference      Peak Flow      Pain Score 10     Pain Loc      Pain Edu?      Excl. in [place]?    Constitutional: Alert and oriented. Well appearing and in no acute distress. Eyes: Conjunctivae are normal. PERRL. EOMI. Head: Atraumatic. Neck: No stridor.   Cardiovascular: Normal rate, regular rhythm. Grossly normal heart sounds.  Good peripheral circulation. Respiratory: Normal respiratory effort.  No retractions. Lungs CTAB. Gastrointestinal: Soft and nontender.  On examination of the rectal area there is a linear erythematous area extending from the rectum to near the posterior aspect of the labia.  Area is markedly tender to palpation and edematous.  Skin is intact and no drainage is noted.  No external hemorrhoids are noted. Musculoskeletal: Moves upper and lower extremities no difficulty.  Gait was not tested. Neurologic:  Normal speech and language. No gross focal neurologic deficits are appreciated. No gait instability. Skin:  Skin is warm, dry and intact.  Psychiatric: Mood and affect are normal. Speech and behavior are normal.  ____________________________________________   LABS (all labs ordered are listed, but only abnormal results are displayed)  Labs Reviewed  CBC WITH DIFFERENTIAL/PLATELET - Abnormal; Notable for the following components:      Result Value   Hemoglobin 11.9 (*)    All other components within normal limits  COMPREHENSIVE METABOLIC PANEL - Abnormal; Notable for the following components:   Sodium 133 (*)    Chloride 97 (*)    Glucose, Bld 378 (*)    Albumin 3.1 (*)    All other components within normal limits    URINALYSIS, COMPLETE (UACMP) WITH MICROSCOPIC - Abnormal; Notable for the following components:   Color, Urine YELLOW (*)    APPearance CLEAR (*)    Specific Gravity, Urine 1.038 (*)    Glucose, UA >=500 (*)    Ketones, ur 80 (*)    Bacteria, UA RARE (*)    All other components within normal limits  LACTIC ACID, PLASMA  POC URINE PREG, ED  POCT PREGNANCY, URINE   ____________________________________________  EKG  ____________________________________________  RADIOLOGY   Official radiology report(s): No results found.  ____________________________________________   PROCEDURES  Procedure(s) performed (including Critical Care):  Procedures   ____________________________________________   INITIAL IMPRESSION / ASSESSMENT AND PLAN / ED COURSE  As part of my medical decision making, I reviewed the following data  within the electronic MEDICAL RECORD NUMBER Notes from prior ED visits and [place] Controlled Substance Database  34 year old female presents to the ED with complaint of rectal pain for the last 3 days.  Patient denies any previous hemorrhoids or history of constipation.  On exam area appears a perirectal abscess which could be tracking as there is a linear edematous tissue that is markedly tender from this area.  Patient was given fentanyl to help control her pain.  Lab work and CT scan was ordered.  Patient was discussed with Anselmo Pickler, PA-C who will be taking over the care of this patient at this time.  ____________________________________________   FINAL CLINICAL IMPRESSION(S) / ED DIAGNOSES  Final diagnoses:  Rectal pain     ED Discharge Orders    None       Note:  This document was prepared using Dragon voice recognition software and may include unintentional dictation errors.    Johnn Hai, PA-C 06/02/19 1638    Harvest Dark, MD 06/03/19 1348

## 2019-06-02 NOTE — ED Triage Notes (Signed)
Pt arrived via POV with report of rectal pain x 3 days, pt states pain when sitting and when passing gas.  Upon exam it does not appear to be an abscess but possible prolapse.   Pt tearful in triage.

## 2019-06-04 LAB — HEMOGLOBIN A1C
Hgb A1c MFr Bld: 13.2 % — ABNORMAL HIGH (ref 4.8–5.6)
Mean Plasma Glucose: 332 mg/dL

## 2019-08-16 DIAGNOSIS — Z5181 Encounter for therapeutic drug level monitoring: Secondary | ICD-10-CM | POA: Diagnosis not present

## 2019-10-01 DIAGNOSIS — Z20822 Contact with and (suspected) exposure to covid-19: Secondary | ICD-10-CM | POA: Diagnosis not present

## 2019-10-10 DIAGNOSIS — Z20822 Contact with and (suspected) exposure to covid-19: Secondary | ICD-10-CM | POA: Diagnosis not present

## 2019-10-24 DIAGNOSIS — Z20822 Contact with and (suspected) exposure to covid-19: Secondary | ICD-10-CM | POA: Diagnosis not present

## 2020-04-11 ENCOUNTER — Other Ambulatory Visit: Payer: Self-pay

## 2020-04-11 ENCOUNTER — Emergency Department
Admission: EM | Admit: 2020-04-11 | Discharge: 2020-04-11 | Disposition: A | Discharge status: Home / Self Care | Payer: Medicaid Other | Attending: Emergency Medicine | Admitting: Emergency Medicine

## 2020-04-11 ENCOUNTER — Emergency Department: Payer: Medicaid Other

## 2020-04-11 DIAGNOSIS — B9689 Other specified bacterial agents as the cause of diseases classified elsewhere: Secondary | ICD-10-CM

## 2020-04-11 DIAGNOSIS — N730 Acute parametritis and pelvic cellulitis: Secondary | ICD-10-CM

## 2020-04-11 DIAGNOSIS — I509 Heart failure, unspecified: Secondary | ICD-10-CM | POA: Diagnosis not present

## 2020-04-11 DIAGNOSIS — R102 Pelvic and perineal pain: Secondary | ICD-10-CM | POA: Diagnosis not present

## 2020-04-11 DIAGNOSIS — N760 Acute vaginitis: Secondary | ICD-10-CM | POA: Diagnosis not present

## 2020-04-11 DIAGNOSIS — Z7984 Long term (current) use of oral hypoglycemic drugs: Secondary | ICD-10-CM | POA: Insufficient documentation

## 2020-04-11 DIAGNOSIS — N739 Female pelvic inflammatory disease, unspecified: Secondary | ICD-10-CM | POA: Diagnosis not present

## 2020-04-11 DIAGNOSIS — R1030 Lower abdominal pain, unspecified: Secondary | ICD-10-CM | POA: Diagnosis present

## 2020-04-11 DIAGNOSIS — F17210 Nicotine dependence, cigarettes, uncomplicated: Secondary | ICD-10-CM | POA: Insufficient documentation

## 2020-04-11 DIAGNOSIS — I110 Hypertensive heart disease with heart failure: Secondary | ICD-10-CM | POA: Insufficient documentation

## 2020-04-11 DIAGNOSIS — R197 Diarrhea, unspecified: Secondary | ICD-10-CM | POA: Diagnosis not present

## 2020-04-11 DIAGNOSIS — E119 Type 2 diabetes mellitus without complications: Secondary | ICD-10-CM | POA: Diagnosis not present

## 2020-04-11 LAB — COMPREHENSIVE METABOLIC PANEL
ALT: 14 U/L (ref 0–44)
AST: 22 U/L (ref 15–41)
Albumin: 3.5 g/dL (ref 3.5–5.0)
Alkaline Phosphatase: 76 U/L (ref 38–126)
Anion gap: 11 (ref 5–15)
BUN: 10 mg/dL (ref 6–20)
CO2: 26 mmol/L (ref 22–32)
Calcium: 9.4 mg/dL (ref 8.9–10.3)
Chloride: 97 mmol/L — ABNORMAL LOW (ref 98–111)
Creatinine, Ser: 0.68 mg/dL (ref 0.44–1.00)
GFR, Estimated: >60 mL/min (ref >60)
Glucose, Bld: 155 mg/dL — ABNORMAL HIGH (ref 70–99)
Potassium: 4.6 mmol/L (ref 3.5–5.1)
Sodium: 134 mmol/L — ABNORMAL LOW (ref 135–145)
Total Bilirubin: 0.9 mg/dL (ref 0.3–1.2)
Total Protein: 7.9 g/dL (ref 6.5–8.1)

## 2020-04-11 LAB — URINALYSIS, COMPLETE (UACMP) WITH MICROSCOPIC
Bilirubin Urine: NEGATIVE
Glucose, UA: NEGATIVE mg/dL
Hgb urine dipstick: NEGATIVE
Ketones, ur: 5 mg/dL — AB
Leukocytes,Ua: NEGATIVE
Nitrite: NEGATIVE
Protein, ur: NEGATIVE mg/dL
Specific Gravity, Urine: 1.017 (ref 1.005–1.030)
pH: 5 (ref 5.0–8.0)

## 2020-04-11 LAB — CBC
HCT: 39.3 % (ref 36.0–46.0)
Hemoglobin: 12.3 g/dL (ref 12.0–15.0)
MCH: 26.6 pg (ref 26.0–34.0)
MCHC: 31.3 g/dL (ref 30.0–36.0)
MCV: 84.9 fL (ref 80.0–100.0)
Platelets: 349 10*3/uL (ref 150–400)
RBC: 4.63 MIL/uL (ref 3.87–5.11)
RDW: 13 % (ref 11.5–15.5)
WBC: 6.8 10*3/uL (ref 4.0–10.5)
nRBC: 0 % (ref 0.0–0.2)

## 2020-04-11 LAB — WET PREP, GENITAL
Sperm: NONE SEEN
Trich, Wet Prep: NONE SEEN
WBC, Wet Prep HPF POC: NONE SEEN
Yeast Wet Prep HPF POC: NONE SEEN

## 2020-04-11 LAB — POC URINE PREG, ED: Preg Test, Ur: NEGATIVE

## 2020-04-11 LAB — CHLAMYDIA/NGC RT PCR (ARMC ONLY)
Chlamydia Tr: NOT DETECTED
N gonorrhoeae: NOT DETECTED

## 2020-04-11 LAB — LIPASE, BLOOD: Lipase: 29 U/L (ref 11–51)

## 2020-04-11 MED ORDER — CEFTRIAXONE SODIUM 1 G IJ SOLR
500.0000 mg | Freq: Once | INTRAMUSCULAR | Status: AC
  Administered 2020-04-11: 500 mg via INTRAMUSCULAR
  Filled 2020-04-11: qty 10

## 2020-04-11 MED ORDER — ONDANSETRON 4 MG PO TBDP
4.0000 mg | ORAL_TABLET | Freq: Once | ORAL | Status: AC
  Administered 2020-04-11: 4 mg via ORAL
  Filled 2020-04-11: qty 1

## 2020-04-11 MED ORDER — ONDANSETRON HCL 4 MG PO TABS: 4.0000 mg | ORAL_TABLET | Freq: Three times a day (TID) | ORAL | 0 refills | Status: DC | PRN

## 2020-04-11 MED ORDER — METRONIDAZOLE 500 MG PO TABS: 500.0000 mg | ORAL_TABLET | Freq: Two times a day (BID) | ORAL | 0 refills | Status: AC

## 2020-04-11 MED ORDER — LIDOCAINE HCL (PF) 1 % IJ SOLN
INTRAMUSCULAR | Status: AC
  Administered 2020-04-11: 2.1 mL
  Filled 2020-04-11: qty 5

## 2020-04-11 MED ORDER — DOXYCYCLINE HYCLATE 100 MG PO CAPS: 100.0000 mg | ORAL_CAPSULE | Freq: Two times a day (BID) | ORAL | 0 refills | Status: AC

## 2020-04-11 NOTE — ED Notes (Signed)
EDP and Jacque RN at bedside for pelvic exam

## 2020-04-11 NOTE — ED Provider Notes (Signed)
[facility] Emergency Department Provider Note  ____________________________________________   Event Date/Time   First MD Initiated Contact with Patient 04/11/20 1117     (approximate)  I have reviewed the triage vital signs and the nursing notes.   HISTORY  Chief Complaint Abdominal Pain   HPI [name] Wolf is a 35 y.o. female with a past medical history of HTN, DM, CHF and obesity who presents for assessment of approximately 3 days of nonbloody nonbilious vomiting, diarrhea and some abnormal vaginal discharge as well as some crampy lower abdominal pain.  She states she had some nausea and vomiting last week but this symptom resolved before getting worse again 3 days ago.  She denies any headache, earache, sore throat, chest pain, cough, shortness of breath, back pain, fevers, chills, burning with urination, blood in her urine, rash or extremity pain.  No recent falls or injuries.  She states she is currently sexually active.         Past Medical History:  Diagnosis Date  . CHF (congestive heart failure) ([place])   . Gestational diabetes   . Hx of gallstones 45ys old   LAP removal of stones  . Hypertension   . Obesity, Class III, BMI 40-49.9 (morbid obesity) ([place])   . Pre-diabetes     Patient Active Problem List   Diagnosis Date Noted  . HTN (hypertension) 06/06/2017  . Diabetes ([place]) 06/06/2017  . CHF (congestive heart failure) ([place]) 05/17/2017  . Anemia of pregnancy 10/12/2015  . Family history of congenital anomalies   . Family or maternal historic risk of congenital anomaly 04/10/2015  . Tobacco abuse 04/10/2015  . Genetic screening 04/10/2015  . Rubella non-immune status, antepartum 04/10/2015  . Acanthosis nigricans 05/08/2013  . Acid reflux 05/08/2013  . Snores 05/08/2013    Past Surgical History:  Procedure Laterality Date  . LAPAROSCOPY  35 yo   gallstones removed    Prior to Admission medications   Medication Sig Start  Date End Date Taking? Authorizing Provider  doxycycline (VIBRAMYCIN) 100 MG capsule Take 1 capsule (100 mg total) by mouth 2 (two) times daily for 14 days. 04/11/20 04/25/20 Yes Lucrezia Starch, MD  metroNIDAZOLE (FLAGYL) 500 MG tablet Take 1 tablet (500 mg total) by mouth 2 (two) times daily for 14 days. 04/11/20 04/25/20 Yes Lucrezia Starch, MD  ondansetron (ZOFRAN) 4 MG tablet Take 1 tablet (4 mg total) by mouth every 8 (eight) hours as needed for up to 10 doses for nausea or vomiting. 04/11/20  Yes Lucrezia Starch, MD  blood glucose meter kit and supplies KIT Dispense based on patient and insurance preference. Use up to four times daily as directed. (FOR ICD-9 250.00, 250.01). 05/16/17   Henreitta Leber, MD  glimepiride (AMARYL) 1 MG tablet Take 1 tablet (1 mg total) by mouth daily with breakfast. 11/09/18 02/07/19  Gregor Hams, MD  ibuprofen (ADVIL,MOTRIN) 600 MG tablet Take 1 tablet (600 mg total) by mouth every 6 (six) hours as needed. 10/10/17   Laban Emperor, PA-C  lidocaine (LIDODERM) 5 % Place 1 patch onto the skin daily. Remove & Discard patch within 12 hours or as directed by MD 10/10/17   Laban Emperor, PA-C  metFORMIN (GLUCOPHAGE) 500 MG tablet Take 1 tablet (500 mg total) by mouth 2 (two) times daily with a meal. 11/09/18 02/07/19  Gregor Hams, MD    Allergies Patient has no known allergies.  Family History  Problem Relation Age of Onset  .  Hypertension Mother   . Diabetes Sister   . Migraines Sister     Social History Social History   Tobacco Use  . Smoking status: Current Some Day Smoker    Packs/day: 0.25    Types: Cigarettes  . Smokeless tobacco: Never Used  Vaping Use  . Vaping Use: Never used  Substance Use Topics  . Alcohol use: No    Alcohol/week: 0.0 standard drinks  . Drug use: No    Review of Systems  Review of Systems  Constitutional: Negative for chills and fever.  HENT: Negative for sore throat.   Eyes: Negative for pain.  Respiratory:  Negative for cough and stridor.   Cardiovascular: Negative for chest pain.  Gastrointestinal: Positive for abdominal pain, diarrhea, nausea and vomiting.  Genitourinary: Negative for dysuria.  Musculoskeletal: Negative for myalgias.  Skin: Negative for rash.  Neurological: Negative for seizures, loss of consciousness and headaches.  Psychiatric/Behavioral: Negative for suicidal ideas.  All other systems reviewed and are negative.     ____________________________________________   PHYSICAL EXAM:  VITAL SIGNS: ED Triage Vitals  Enc Vitals Group     BP 04/11/20 0955 (!) 144/99     Pulse Rate 04/11/20 0955 (!) 105     Resp 04/11/20 0955 16     Temp 04/11/20 0955 98 F (36.7 C)     Temp Source 04/11/20 0955 Oral     SpO2 04/11/20 0955 99 %     Weight 04/11/20 0956 182 lb 12.8 oz (82.9 kg)     Height 04/11/20 0956 5' 3"  (1.6 m)     Head Circumference --      Peak Flow --      Pain Score 04/11/20 0956 3     Pain Loc --      Pain Edu? --      Excl. in [place]? --    Vitals:   04/11/20 0955 04/11/20 1248  BP: (!) 144/99 (!) 137/96  Pulse: (!) 105 84  Resp: 16 16  Temp: 98 F (36.7 C) 98.1 F (36.7 C)  SpO2: 99% 99%   Physical Exam Vitals and nursing note reviewed. Exam conducted with a chaperone present.  Constitutional:      General: She is not in acute distress.    Appearance: She is well-developed and well-nourished.  HENT:     Head: Normocephalic and atraumatic.  Eyes:     Conjunctiva/sclera: Conjunctivae normal.  Cardiovascular:     Rate and Rhythm: Normal rate and regular rhythm.     Heart sounds: No murmur heard.   Pulmonary:     Effort: Pulmonary effort is normal. No respiratory distress.     Breath sounds: Normal breath sounds.  Abdominal:     Palpations: Abdomen is soft.     Tenderness: There is abdominal tenderness in the suprapubic area. There is no right CVA tenderness or left CVA tenderness.  Genitourinary:    Cervix: Discharge, friability and  erythema present. No lesion or cervical bleeding.  Musculoskeletal:        General: No edema.     Cervical back: Neck supple.  Skin:    General: Skin is warm and dry.     Capillary Refill: Capillary refill takes less than 2 seconds.  Neurological:     Mental Status: She is alert and oriented to person, place, and time.  Psychiatric:        Mood and Affect: Mood and affect and mood normal.      ____________________________________________  LABS (all labs ordered are listed, but only abnormal results are displayed)  Labs Reviewed  WET PREP, GENITAL - Abnormal; Notable for the following components:      Result Value   Clue Cells Wet Prep HPF POC PRESENT (*)    All other components within normal limits  COMPREHENSIVE METABOLIC PANEL - Abnormal; Notable for the following components:   Sodium 134 (*)    Chloride 97 (*)    Glucose, Bld 155 (*)    All other components within normal limits  URINALYSIS, COMPLETE (UACMP) WITH MICROSCOPIC - Abnormal; Notable for the following components:   Color, Urine YELLOW (*)    APPearance HAZY (*)    Ketones, ur 5 (*)    Bacteria, UA RARE (*)    All other components within normal limits  CHLAMYDIA/NGC RT PCR ([facility] ONLY)  LIPASE, BLOOD  CBC  POC URINE PREG, ED   ____________________________________________  EKG   ____________________________________________  RADIOLOGY  ED MD interpretation: No evidence of torsion, large ovarian cyst, endometrial abnormalities or evidence of abscess.  Official radiology report(s): US PELVIC COMPLETE W TRANSVAGINAL AND TORSION R/O  Result Date: 04/11/2020 CLINICAL DATA:  Pelvic pain for 2 weeks EXAM: TRANSABDOMINAL AND TRANSVAGINAL ULTRASOUND OF PELVIS DOPPLER ULTRASOUND OF OVARIES TECHNIQUE: Both transabdominal and transvaginal ultrasound examinations of the pelvis were performed. Transabdominal technique was performed for global imaging of the pelvis including uterus, ovaries, adnexal regions,  and pelvic cul-de-sac. It was necessary to proceed with endovaginal exam following the transabdominal exam to visualize the ovaries and endometrium. Color and duplex Doppler ultrasound was utilized to evaluate blood flow to the ovaries. COMPARISON:  None. FINDINGS: Uterus Measurements: 8.3 x 4.9 x 5.6 cm = volume: 122 mL. No fibroids or other mass visualized. Endometrium Thickness: 9.4 mm.  No focal abnormality visualized. Right ovary Measurements: 2.8 x 1.6 x 1.8 cm = volume: 4 mL. Normal appearance/no adnexal mass. Left ovary Measurements: 3.4 x 1.7 x 2.8 cm = volume: 9 mL. The left ovary contains multiple normal appearing follicles. Pulsed Doppler evaluation of both ovaries demonstrates normal low-resistance arterial and venous waveforms. Other findings No abnormal free fluid. IMPRESSION: 1. No cause for pain identified. The uterus, endometrium, and ovaries are within normal limits. Electronically Signed   By: Dorise Bullion III M.D   On: 04/11/2020 12:29    ____________________________________________   PROCEDURES  Procedure(s) performed (including Critical Care):  Procedures   ____________________________________________   INITIAL IMPRESSION / ASSESSMENT AND PLAN / ED COURSE      Patient presents with above-stated history exam for assessment of couple days of nausea vomiting and diarrhea associate with some pelvic pain and abnormal vaginal discharge.  On arrival she is afebrile hemodynamically stable.  She has a mild tenderness suprapubic area on exam as well as some erythema and friability in her cervical os.   Differential occludes but is not limited to cystitis, gastroenteritis, PID, TOA, torsion, appendicitis, cholecystitis, diverticulitis pancreatitis.  CMP shows no significant electrolyte or metabolic derangements.  No evidence of cholestasis or hepatitis.  Lipase not consistent with acute pancreatitis.  CBC shows no leukocytosis or acute anemia.  Urine pregnancy test is negative  and UA does not appear consistent with infection.  Wet prep positive for clue cells.  Given findings on GU exam patient was empirically treated for PID with Rocephin.  Will also empirically treat with 2 weeks of doxycycline.  Rx written for Flagyl to cover for bacterial vaginosis.  No findings on history or exam to suggest  kidney stone or pyelonephritis.  Overall low suspicion for appendicitis given absence of focal tenderness in the right lower quadrant or elevated white blood cell count and more clear etiology for pain on exam i.e. PID.  Ultrasound shows no evidence of torsion, abscess or any other clear acute abdominal pelvic process.  Given stable vitals with otherwise reassuring exam and work-up believe patient safe for discharge with plan for outpatient follow-up.  Discharged stable condition.  Strict return cautions advised and discussed.  Rx written for Doxy, Flagyl, and Zofran.  Instructed to refrain from any EtOH use over the next 2 weeks.  Patient voiced understanding and agreement this plan.   ____________________________________________   FINAL CLINICAL IMPRESSION(S) / ED DIAGNOSES  Final diagnoses:  Pelvic pain  PID (acute pelvic inflammatory disease)  Bacterial vaginosis    Medications  ondansetron (ZOFRAN-ODT) disintegrating tablet 4 mg (4 mg Oral Given 04/11/20 1133)  cefTRIAXone (ROCEPHIN) injection 500 mg (500 mg Intramuscular Given 04/11/20 1227)  lidocaine (PF) (XYLOCAINE) 1 % injection (2.1 mLs  Given 04/11/20 1230)     ED Discharge Orders         Ordered    doxycycline (VIBRAMYCIN) 100 MG capsule  2 times daily        04/11/20 1241    metroNIDAZOLE (FLAGYL) 500 MG tablet  2 times daily        04/11/20 1241    ondansetron (ZOFRAN) 4 MG tablet  Every 8 hours PRN        04/11/20 1245           Note:  This document was prepared using Dragon voice recognition software and may include unintentional dictation errors.   Lucrezia Starch, MD 04/11/20 1350

## 2020-04-11 NOTE — ED Notes (Signed)
Pt to ultrasound

## 2020-04-11 NOTE — ED Triage Notes (Signed)
Pt A&O, ambulatory. No distress noted. Pt states abd pain and N&V&D. States happened last week as well. Symptoms this week x 3 days.

## 2020-05-19 DIAGNOSIS — Z5181 Encounter for therapeutic drug level monitoring: Secondary | ICD-10-CM | POA: Diagnosis not present

## 2020-05-26 DIAGNOSIS — Z5181 Encounter for therapeutic drug level monitoring: Secondary | ICD-10-CM | POA: Diagnosis not present

## 2020-07-29 DIAGNOSIS — Z5181 Encounter for therapeutic drug level monitoring: Secondary | ICD-10-CM | POA: Diagnosis not present

## 2020-08-12 DIAGNOSIS — Z5181 Encounter for therapeutic drug level monitoring: Secondary | ICD-10-CM | POA: Diagnosis not present

## 2020-08-31 DIAGNOSIS — Z5181 Encounter for therapeutic drug level monitoring: Secondary | ICD-10-CM | POA: Diagnosis not present

## 2020-09-29 DIAGNOSIS — Z5181 Encounter for therapeutic drug level monitoring: Secondary | ICD-10-CM | POA: Diagnosis not present

## 2020-09-30 DIAGNOSIS — Z20822 Contact with and (suspected) exposure to covid-19: Secondary | ICD-10-CM | POA: Diagnosis not present

## 2020-10-20 DIAGNOSIS — Z5181 Encounter for therapeutic drug level monitoring: Secondary | ICD-10-CM | POA: Diagnosis not present

## 2020-11-02 DIAGNOSIS — Z5181 Encounter for therapeutic drug level monitoring: Secondary | ICD-10-CM | POA: Diagnosis not present

## 2020-11-20 ENCOUNTER — Ambulatory Visit (LOCAL_COMMUNITY_HEALTH_CENTER): Payer: Medicaid Other

## 2020-11-20 ENCOUNTER — Other Ambulatory Visit: Payer: Self-pay

## 2020-11-20 DIAGNOSIS — Z111 Encounter for screening for respiratory tuberculosis: Secondary | ICD-10-CM

## 2020-11-23 ENCOUNTER — Other Ambulatory Visit: Payer: Self-pay

## 2020-11-23 ENCOUNTER — Ambulatory Visit (LOCAL_COMMUNITY_HEALTH_CENTER): Payer: Medicaid Other

## 2020-11-23 DIAGNOSIS — Z111 Encounter for screening for respiratory tuberculosis: Secondary | ICD-10-CM

## 2020-11-23 LAB — TB SKIN TEST
Induration: 0 mm
TB Skin Test: NEGATIVE

## 2020-12-29 DIAGNOSIS — Z5181 Encounter for therapeutic drug level monitoring: Secondary | ICD-10-CM | POA: Diagnosis not present

## 2021-01-20 ENCOUNTER — Ambulatory Visit: Payer: Medicaid Other | Admitting: Internal Medicine

## 2021-01-29 ENCOUNTER — Ambulatory Visit: Payer: Medicaid Other | Admitting: Internal Medicine

## 2021-02-04 ENCOUNTER — Ambulatory Visit: Payer: Medicaid Other | Admitting: Internal Medicine

## 2021-02-26 ENCOUNTER — Other Ambulatory Visit: Payer: Self-pay

## 2021-02-26 ENCOUNTER — Encounter: Payer: Self-pay | Admitting: Advanced Practice Midwife

## 2021-02-26 ENCOUNTER — Ambulatory Visit (LOCAL_COMMUNITY_HEALTH_CENTER): Payer: Medicaid Other | Admitting: Advanced Practice Midwife

## 2021-02-26 VITALS — BP 118/77 | Ht 62.5 in | Wt 202.2 lb

## 2021-02-26 DIAGNOSIS — Z3009 Encounter for other general counseling and advice on contraception: Secondary | ICD-10-CM

## 2021-02-26 DIAGNOSIS — D649 Anemia, unspecified: Secondary | ICD-10-CM

## 2021-02-26 DIAGNOSIS — E669 Obesity, unspecified: Secondary | ICD-10-CM

## 2021-02-26 DIAGNOSIS — Z720 Tobacco use: Secondary | ICD-10-CM

## 2021-02-26 DIAGNOSIS — Z62810 Personal history of physical and sexual abuse in childhood: Secondary | ICD-10-CM

## 2021-02-26 DIAGNOSIS — E119 Type 2 diabetes mellitus without complications: Secondary | ICD-10-CM

## 2021-02-26 HISTORY — DX: Personal history of physical and sexual abuse in childhood: Z62.810

## 2021-02-26 HISTORY — DX: Anemia, unspecified: D64.9

## 2021-02-26 LAB — HEMOGLOBIN, FINGERSTICK: Hemoglobin: 11 g/dL — ABNORMAL LOW (ref 11.1–15.9)

## 2021-02-26 LAB — WET PREP FOR TRICH, YEAST, CLUE
Trichomonas Exam: NEGATIVE
Yeast Exam: NEGATIVE

## 2021-02-26 MED ORDER — FERROUS SULFATE 324 (65 FE) MG PO TBEC
325.0000 mg | DELAYED_RELEASE_TABLET | Freq: Two times a day (BID) | ORAL | 0 refills | Status: DC
Start: 2021-02-26 — End: 2021-05-28

## 2021-02-26 NOTE — Progress Notes (Signed)
Patient here for PE and STD testing. Last Pap was 04/09/2015, NIL.Marland KitchenBurt Knack, RN

## 2021-02-26 NOTE — Progress Notes (Signed)
Wet mount reviewed, no treatment indicated. Patient given dental list, Nexplanon brochure and [facility] phone number in addition to other FP resources.Burt Knack, RN

## 2021-02-26 NOTE — Progress Notes (Addendum)
[facility] [address] Main Number: [phone]    Family Planning Visit- Initial Visit  Subjective:  [name] Wolf is a 36 y.o. SBF vaper G2P2002 (14,5)  being seen today for an initial annual visit and to discuss reproductive life planning.  The patient is currently using No Method - Other Reason for pregnancy prevention. Patient reports she/her/hers  does not want a pregnancy in the next year.  Patient has the following medical conditions has Acanthosis nigricans; Acid reflux; Family or maternal historic risk of congenital anomaly; Tobacco abuse; Genetic screening; Rubella non-immune status, antepartum; Family history of congenital anomalies; Snores; Anemia of pregnancy; CHF (congestive heart failure) (HCC); HTN (hypertension); Diabetes (HCC); and Obesity BMI=36.3 on their problem list.  Chief Complaint  Patient presents with   Annual Exam    Patient reports here for physical and pap. Last pap 04/09/15 neg. Last sex 02/11/21 without condom; with current partner x 5 mo; 2 sex partners in last 3 mo. LMP 01/30/21. Last vaped today. Last MJ 12/21/20. Last cig 12/2020. Last ETOH 12/21/20 (2 beers+3 c. Liquor). Last dental exam 12/2020. Working 15 hours/wk and living with her 2 kids. Taking her diabetic daughter's Metformin BID because she doesn't have a primary care MD.  +anxious, +cry q 2 wks, poor sleep, poor appetite, energy level wnl, -anhedonia, -SI/HI, PHQ-9=7  Patient denies cigars  Body mass index is 36.39 kg/m. - Patient is eligible for diabetes screening based on BMI and age >24?  not applicable HA1C ordered? not applicable  Patient reports 2  partner/s in last year. Desires STI screening?  Yes  Has patient been screened once for HCV in the past?  No  No results found for: HCVAB  Does the patient have current drug use (including MJ), have a partner with drug use, and/or has been incarcerated since last result? No   If yes-- Screen for HCV through [facility]   Does the patient meet criteria for HBV testing? No  Criteria:  -Household, sexual or needle sharing contact with HBV -History of drug use -HIV positive -Those with known Hep C   Health Maintenance Due  Topic Date Due   COVID-19 Vaccine (1) Never done   Pneumococcal Vaccine 18-58 Years old (1 - PCV) Never done   FOOT EXAM  Never done   OPHTHALMOLOGY EXAM  Never done   URINE MICROALBUMIN  Never done   Hepatitis C Screening  Never done   PAP SMEAR-Modifier  04/08/2018   HEMOGLOBIN A1C  12/02/2019   INFLUENZA VACCINE  09/21/2020    Review of Systems  All other systems reviewed and are negative.  The following portions of the patient's history were reviewed and updated as appropriate: allergies, current medications, past family history, past medical history, past social history, past surgical history and problem list. Problem list updated.   See flowsheet for other program required questions.  Objective:   Vitals:   02/26/21 1508  BP: 118/77  Weight: 202 lb 3.2 oz (91.7 kg)  Height: 5' 2.5" (1.588 m)    Physical Exam Constitutional:      Appearance: Normal appearance. She is obese.  HENT:     Head: Normocephalic and atraumatic.     Mouth/Throat:     Mouth: Mucous membranes are moist.     Comments: Last dental exam 12/2020; encouraged exams 2x/yr Eyes:     Conjunctiva/sclera: Conjunctivae normal.  Neck:     Thyroid: No thyroid mass, thyromegaly or thyroid  tenderness.  Cardiovascular:     Rate and Rhythm: Normal rate and regular rhythm.  Pulmonary:     Effort: Pulmonary effort is normal.     Breath sounds: Normal breath sounds.  Chest:  Breasts:    Right: Normal.     Left: Normal.  Abdominal:     Palpations: Abdomen is soft.     Comments: Poor tone, increased adipose, soft without masses or tenderness  Genitourinary:    General: Normal vulva.     Exam position: Lithotomy position.     Vagina: Vaginal  discharge (white creamy leukorrhea, ph<4.5) present.     Uterus: Normal. With uterine prolapse (cervical prolapse to introitus with valsalva).      Adnexa: Right adnexa normal and left adnexa normal.     Rectum: External hemorrhoid (small flesh colored hemorrhoid with constipation and rectal bleeding with BM only on TP) present.     Comments: Cervix with prolapse at introitus with valsalva Pap done Musculoskeletal:        General: Normal range of motion.     Cervical back: Normal range of motion and neck supple.  Skin:    General: Skin is warm and dry.  Neurological:     Mental Status: She is alert.  Psychiatric:        Mood and Affect: Mood normal.      Assessment and Plan:  [name] Wolf is a 36 y.o. female presenting to the [facility] for an initial annual wellness/contraceptive visit  Contraception counseling: Reviewed all forms of birth control options in the tiered based approach. available including abstinence; over the counter/barrier methods; hormonal contraceptive medication including pill, patch, ring, injection,contraceptive implant, ECP; hormonal and nonhormonal IUDs; permanent sterilization options including vasectomy and the various tubal sterilization modalities. Risks, benefits, and typical effectiveness rates were reviewed.  Questions were answered.  Written information was also given to the patient to review.  Patient desires Female Condom, this was prescribed for patient.    The patient will follow up in  prn for surveillance.  The patient was told to call with any further questions, or with any concerns about this method of contraception.  Emphasized use of condoms 100% of the time for STI prevention.  Patient was  not offered ECP based on not meeting criteria. ECP was not accepted by the patient. ECP counseling was not given - see RN documentation  1. Obesity, unspecified classification, unspecified obesity type, unspecified whether serious  comorbidity present   2. Family planning Please give pt [facility] # to call for cervical prolapse for possible pessary Please give pt contact info for Kathreen Cosier, LCSW Treat wet mount per standing orders Pt desires Nexplanon brochure and condoms only today Immunization nurse consult - HIV [place] [facility] - Syphilis Serology, [place] [facility] - WET PREP FOR TRICH, YEAST, CLUE - Hemoglobin, venipuncture - Chlamydia/Gonorrhea [place] [facility] - IGP, [place] HPV  3. Type 2 diabetes mellitus without complication, without long-term current use of insulin (HCC) Pt doesn't have a primary care MD. She has been taking her daughter's Metfomin BID Strongly encouraged primary care MD to manage her diabetes asap     No follow-ups on file.  Future Appointments  Date Time Provider Department Center  03/08/2021  2:40 PM Corky Downs, MD [facility] None    Alberteen Spindle, CNM

## 2021-03-02 LAB — IGP, APTIMA HPV
HPV Aptima: POSITIVE — AB
PAP Smear Comment: 0

## 2021-03-03 ENCOUNTER — Encounter: Payer: Self-pay | Admitting: Advanced Practice Midwife

## 2021-03-03 DIAGNOSIS — R87619 Unspecified abnormal cytological findings in specimens from cervix uteri: Secondary | ICD-10-CM | POA: Insufficient documentation

## 2021-03-03 HISTORY — DX: Unspecified abnormal cytological findings in specimens from cervix uteri: R87.619

## 2021-03-05 ENCOUNTER — Telehealth: Payer: Self-pay

## 2021-03-05 NOTE — Telephone Encounter (Signed)
Calling pt regarding positive chlamydia result from 02/26/21 vaginal specimen. Pt needs tx appt.

## 2021-03-05 NOTE — Telephone Encounter (Signed)
Phone call to pt. Pt confirmed password from last visit.  Pt counseled about positive chlamydia result. States she is not using BC, LMP 02/26/21 - Normal; No sex since last period, at this time. Pt counseled to eat before coming in, RN will go over some of the same questions to see if anything has changed. Pt scheduled for 03/09/21 tx.

## 2021-03-08 ENCOUNTER — Ambulatory Visit: Payer: Medicaid Other | Admitting: Internal Medicine

## 2021-03-09 ENCOUNTER — Ambulatory Visit: Payer: Self-pay

## 2021-03-09 ENCOUNTER — Other Ambulatory Visit: Payer: Self-pay

## 2021-03-09 DIAGNOSIS — A749 Chlamydial infection, unspecified: Secondary | ICD-10-CM

## 2021-03-09 MED ORDER — DOXYCYCLINE HYCLATE 100 MG PO TABS: 100.0000 mg | ORAL_TABLET | Freq: Two times a day (BID) | ORAL | 0 refills | Status: AC

## 2021-03-09 NOTE — Progress Notes (Signed)
In Nurse Clinic for chlamydia treatment. Reports LMP 02/26/2021 (exact date) and described as normal. Last sex 02/11/2021 without condom (exact date.) Reports no birth control method. Treated per SO Dr Lubertha Sayres with Doxycycline 100 mg #14 with instructions to take one capsule by mouth twice a day for 7 days. RN dispensed the Doxy 100 mg #14. Instructions explained and advised to take med with food and to contact [facility] if vomits within 2 hrs of taking med. Questions answered and reports understanding. Josie Saunders, RN

## 2021-03-10 NOTE — Telephone Encounter (Signed)
Pt treated 03/09/21.

## 2021-03-24 ENCOUNTER — Ambulatory Visit: Payer: Self-pay | Admitting: Licensed Clinical Social Worker
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
## 2021-05-17 ENCOUNTER — Encounter: Payer: Self-pay | Admitting: Nurse Practitioner

## 2021-05-17 ENCOUNTER — Ambulatory Visit: Payer: Self-pay | Admitting: Nurse Practitioner

## 2021-05-17 ENCOUNTER — Other Ambulatory Visit: Payer: Self-pay

## 2021-05-17 DIAGNOSIS — Z113 Encounter for screening for infections with a predominantly sexual mode of transmission: Secondary | ICD-10-CM

## 2021-05-17 DIAGNOSIS — F419 Anxiety disorder, unspecified: Secondary | ICD-10-CM

## 2021-05-17 DIAGNOSIS — F32A Depression, unspecified: Secondary | ICD-10-CM
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
## 2021-05-17 NOTE — Progress Notes (Signed)
Pt here for STD screening.  Pt declined condoms. Rockney Grenz M Shaneta Cervenka, RN  

## 2021-05-21 LAB — WET PREP FOR TRICH, YEAST, CLUE
Clue Cell Exam: NEGATIVE
Trichomonas Exam: NEGATIVE
Yeast Exam: NEGATIVE

## 2021-05-21 LAB — GONOCOCCUS CULTURE

## 2021-05-27 ENCOUNTER — Encounter: Payer: Self-pay | Admitting: Nurse Practitioner

## 2021-05-27 ENCOUNTER — Other Ambulatory Visit: Payer: Self-pay | Admitting: Nurse Practitioner

## 2021-05-27 ENCOUNTER — Ambulatory Visit: Payer: Medicaid Other | Admitting: Nurse Practitioner

## 2021-05-27 ENCOUNTER — Other Ambulatory Visit: Payer: Self-pay

## 2021-05-27 VITALS — BP 120/84 | HR 98 | Temp 98.2°F | Resp 16 | Ht 62.0 in | Wt 206.4 lb

## 2021-05-27 DIAGNOSIS — Z13220 Encounter for screening for lipoid disorders: Secondary | ICD-10-CM | POA: Diagnosis not present

## 2021-05-27 DIAGNOSIS — E119 Type 2 diabetes mellitus without complications: Secondary | ICD-10-CM

## 2021-05-27 DIAGNOSIS — Z7689 Persons encountering health services in other specified circumstances: Secondary | ICD-10-CM | POA: Diagnosis not present

## 2021-05-27 DIAGNOSIS — R197 Diarrhea, unspecified: Secondary | ICD-10-CM

## 2021-05-27 DIAGNOSIS — N812 Incomplete uterovaginal prolapse: Secondary | ICD-10-CM | POA: Diagnosis not present

## 2021-05-27 DIAGNOSIS — D508 Other iron deficiency anemias: Secondary | ICD-10-CM | POA: Diagnosis not present

## 2021-05-27 MED ORDER — BLOOD GLUCOSE MONITOR KIT: 1.0000 | PACK | 0 refills | Status: DC

## 2021-05-27 NOTE — Telephone Encounter (Signed)
Medication: metFORMIN (GLUCOPHAGE) 500 MG tablet [[id number]] , glimepiride (AMARYL) 1 MG tablet [[id number]]  ENDED, blood glucose meter kit and supplies KIT [[id number]]  ?Pt states that the pharmacy did not receive the request for the supplies ? ?Has the patient contacted their pharmacy? YES ?(Agent: If no, request that the patient contact the pharmacy for the refill. If patient does not wish to contact the pharmacy document the reason why and proceed with request.) ?(Agent: If yes, when and what did the pharmacy advise?) ?[id number] [place] [facility] [address]  [id number] ?Phone:  [phone] Fax:  [phone] ?Preferred Pharmacy (with phone number or street name):  ?Has the patient been seen for an appointment in the last year OR does the patient have an upcoming appointment? YES 05/27/21 ? ?Agent: Please be advised that RX refills may take up to 3 business days. We ask that you follow-up with your pharmacy. ?

## 2021-05-27 NOTE — Progress Notes (Signed)
? ?BP 120/84   Pulse 98   Temp 98.2 ?F (36.8 ?C) (Oral)   Resp 16   Ht 5' 2"  (1.575 m)   Wt 206 lb 6.4 oz (93.6 kg)   LMP 05/25/2021 (Exact Date)   SpO2 99%   BMI 37.75 kg/m?   ? ?Subjective:  ? ? Patient ID: [name] Wolf, female    DOB: 04/09/1985, 36 y.o.   MRN: [id number] ? ?HPI: ?[name] Wolf is a 36 y.o. female ? ?Chief Complaint  ?Patient presents with  ? Establish Care  ? Diabetes  ? ?Establish care: Her last physical was in January 2023. ? ?DM2: She was diagnosed in 2017.  She says she has been out of medication for a long time.  Last A1C was 13.2 on 06/02/2019. She does not check her blood sugar at home. She says she does not have a meter. She reports polyuria and polydipsia. Will get labs, and send in for glucose monitor.  Discussed different treatment plans. She is not interested in using insulin at this time.  ? ?Iron deficiency anemia:  She had her hemoglobin checked in January it was 11.  She says prescribed iron supplements but she says that the iron tablets were making her constipated. Will get labs.  ? ?Cervical prolapse:  She was told at her appointment with the health department that she had a prolapsed cervix.  She says she has noticed it for a few months ago.  Will place referral to gyn.  ? ?Diarrhea: She says she has had diarrhea for about four months. She denies abdominal pain, nausea or vomiting. She says that she does have abdominal cramping when she has diarrhea.  She says she tried pepto bismol and kaopectate with no improvement in the diarrhea.  Will get labs, stool culture and calprotectin.  ? ?Relevant past medical, surgical, family and social history reviewed and updated as indicated. Interim medical history since our last visit reviewed. ?Allergies and medications reviewed and updated. ? ?Review of Systems ? ?Constitutional: Negative for fever or weight change.  ?Respiratory: Negative for cough and shortness of breath.   ?Cardiovascular: Negative for chest pain or  palpitations.  ?Gastrointestinal: Negative for abdominal pain, diarrhea ?Musculoskeletal: Negative for gait problem or joint swelling.  ?Skin: Negative for rash.  ?Neurological: Negative for dizziness or headache.  ?No other specific complaints in a complete review of systems (except as listed in HPI above).  ? ?   ?Objective:  ?  ?BP 120/84   Pulse 98   Temp 98.2 ?F (36.8 ?C) (Oral)   Resp 16   Ht 5' 2"  (1.575 m)   Wt 206 lb 6.4 oz (93.6 kg)   LMP 05/25/2021 (Exact Date)   SpO2 99%   BMI 37.75 kg/m?   ?Wt Readings from Last 3 Encounters:  ?05/27/21 206 lb 6.4 oz (93.6 kg)  ?02/26/21 202 lb 3.2 oz (91.7 kg)  ?04/11/20 182 lb 12.8 oz (82.9 kg)  ?  ?Physical Exam ? ?Constitutional: Patient appears well-developed and well-nourished. Obese  No distress.  ?HEENT: head atraumatic, normocephalic, pupils equal and reactive to light,  neck supple ?Cardiovascular: Normal rate, regular rhythm and normal heart sounds.  No murmur heard. No BLE edema. ?Pulmonary/Chest: Effort normal and breath sounds normal. No respiratory distress. ?Abdominal: Soft.  There is no tenderness. ?Psychiatric: Patient has a normal mood and affect. behavior is normal. Judgment and thought content normal.  ?Results for orders placed or performed in visit on 05/17/21  ?Gonococcus culture  ? Specimen:  Pharynx; Throat  ? [place]     CD- [id number]  ?Result Value Ref Range  ? GC Culture Only Final report   ? Result 1 Comment   ?WET PREP FOR [place], YEAST, CLUE  ? [place]     CD- [id number]  ?Result Value Ref Range  ? Trichomonas Exam Negative Negative  ? Yeast Exam Negative Negative  ? Clue Cell Exam Negative Negative  ? ?   ?Assessment & Plan:  ? ?1. Type 2 diabetes mellitus treated without insulin ([place]) ?-will send in treatment after seeing results of A1C. ?- blood glucose meter kit and supplies KIT; Inject 1 each into the skin as directed. Dispense based on patient and insurance preference. Use up to four times daily as directed. (FOR ICD-9 250.00, 250.01).  please give supplies approved by her insurance  Dispense: 1 each; Refill: 0 ?- COMPLETE METABOLIC PANEL WITH GFR ?- Hemoglobin A1c ?- Gastrointestinal Pathogen Panel PCR ? ?2. Encounter to establish care ?-she say she had a physical at the health department ? ?3. Cervical prolapse ? ?- Ambulatory referral to Gynecology ? ?4. Diarrhea, unspecified type ?-discussed different treatment options based on what the results are ?- Calprotectin, Fecal ?- CBC with Differential/Platelet ?- COMPLETE METABOLIC PANEL WITH GFR ? ?5. Other iron deficiency anemia ? ?- Iron, TIBC and Ferritin Panel ? ?6. Screening for cholesterol level ? ?- Lipid panel  ? ?Follow up plan: ?Return in about 3 months (around 08/26/2021) for follow up. ? ? ? ? ? ?

## 2021-05-27 NOTE — Telephone Encounter (Signed)
[facility] called and spoke to [name] Wolf, [facility] about the refill(s) glucose meter kit requested. Advised it was sent on 05/27/21. She says they have it, but will need a new Rx for strips and lancets sent in.  ?

## 2021-05-27 NOTE — Telephone Encounter (Signed)
Requested medication (s) are due for refill today: Yes ? ?Requested medication (s) are on the active medication list: Yes ? ?Last refill:  Glimepiride 3 months, Metformin 2 years ago ? ?Future visit scheduled:Yes ? ?Notes to clinic:  Unable to refill per protocol due to failed labs, no updated results. Will need new Rx sent to pharmacy for test strips and lancets. ? ? ? ? ? ?Requested Prescriptions  ?Pending Prescriptions Disp Refills  ? metFORMIN (GLUCOPHAGE) 500 MG tablet    ?  Sig: Take 1 tablet (500 mg total) by mouth 2 (two) times daily with a meal.  ?  ? Endocrinology:  Diabetes - Biguanides Failed - 05/27/2021  5:02 PM  ?  ?  Failed - Cr in normal range and within 360 days  ?  Creatinine, Ser  ?Date Value Ref Range Status  ?04/11/2020 0.68 0.44 - 1.00 mg/dL Final  ? ?Creatinine, Urine  ?Date Value Ref Range Status  ?10/11/2015 177 mg/dL Final  ?  ?  ?  ?  Failed - HBA1C is between 0 and 7.9 and within 180 days  ?  Hgb A1c MFr Bld  ?Date Value Ref Range Status  ?06/02/2019 13.2 (H) 4.8 - 5.6 % Final  ?  Comment:  ?  (NOTE) ?        Prediabetes: 5.7 - 6.4 ?        Diabetes: >6.4 ?        Glycemic control for adults with diabetes: <7.0 ?  ?  ?  ?  ?  Failed - eGFR in normal range and within 360 days  ?  GFR calc Af Amer  ?Date Value Ref Range Status  ?06/02/2019 >60 >60 mL/min Final  ? ?GFR, Estimated  ?Date Value Ref Range Status  ?04/11/2020 >60 >60 mL/min Final  ?  Comment:  ?  (NOTE) ?Calculated using the CKD-EPI Creatinine Equation (2021) ?  ?  ?  ?  ?  Failed - B12 Level in normal range and within 720 days  ?  No results found for: VITAMINB12  ?  ?  ?  Failed - CBC within normal limits and completed in the last 12 months  ?  WBC  ?Date Value Ref Range Status  ?04/11/2020 6.8 4.0 - 10.5 K/uL Final  ? ?RBC  ?Date Value Ref Range Status  ?04/11/2020 4.63 3.87 - 5.11 MIL/uL Final  ? ?Hemoglobin  ?Date Value Ref Range Status  ?04/11/2020 12.3 12.0 - 15.0 g/dL Final  ?03/06/2015 11.9 11.1 - 15.9 g/dL Final  ? ?HCT   ?Date Value Ref Range Status  ?04/11/2020 39.3 36.0 - 46.0 % Final  ? ?Hematocrit  ?Date Value Ref Range Status  ?03/06/2015 36.1 34.0 - 46.6 % Final  ? ?MCHC  ?Date Value Ref Range Status  ?04/11/2020 31.3 30.0 - 36.0 g/dL Final  ? ?MCH  ?Date Value Ref Range Status  ?04/11/2020 26.6 26.0 - 34.0 pg Final  ? ?MCV  ?Date Value Ref Range Status  ?04/11/2020 84.9 80.0 - 100.0 fL Final  ?03/06/2015 85 79 - 97 fL Final  ? ?No results found for: PLTCOUNTKUC, LABPLAT, [place] ?RDW  ?Date Value Ref Range Status  ?04/11/2020 13.0 11.5 - 15.5 % Final  ?03/06/2015 13.1 12.3 - 15.4 % Final  ? ?  ?  ?  Passed - Valid encounter within last 6 months  ?  Recent Outpatient Visits   ? ?      ? Today Type 2 diabetes mellitus treated without  insulin ([place])  ? [facility] Bo Merino, FNP  ? ?  ?  ?Future Appointments   ? ?        ? In 3 months Reece Packer, Myna Hidalgo, [facility], [facility]  ? ?  ? ?  ?  ?  ? glimepiride (AMARYL) 1 MG tablet 30 tablet 2  ?  Sig: Take 1 tablet (1 mg total) by mouth daily with breakfast.  ?  ? Endocrinology:  Diabetes - Sulfonylureas Failed - 05/27/2021  5:02 PM  ?  ?  Failed - HBA1C is between 0 and 7.9 and within 180 days  ?  Hgb A1c MFr Bld  ?Date Value Ref Range Status  ?06/02/2019 13.2 (H) 4.8 - 5.6 % Final  ?  Comment:  ?  (NOTE) ?        Prediabetes: 5.7 - 6.4 ?        Diabetes: >6.4 ?        Glycemic control for adults with diabetes: <7.0 ?  ?  ?  ?  ?  Failed - Cr in normal range and within 360 days  ?  Creatinine, Ser  ?Date Value Ref Range Status  ?04/11/2020 0.68 0.44 - 1.00 mg/dL Final  ? ?Creatinine, Urine  ?Date Value Ref Range Status  ?10/11/2015 177 mg/dL Final  ?  ?  ?  ?  Passed - Valid encounter within last 6 months  ?  Recent Outpatient Visits   ? ?      ? Today Type 2 diabetes mellitus treated without insulin ([place])  ? [facility] Bo Merino, FNP  ? ?  ?  ?Future Appointments   ? ?        ? In 3 months Reece Packer, Myna Hidalgo, [facility], [place]  ? ?  ? ?  ?  ?  ? ? ? ? ?

## 2021-05-28 ENCOUNTER — Other Ambulatory Visit: Payer: Self-pay | Admitting: Nurse Practitioner

## 2021-05-28 ENCOUNTER — Other Ambulatory Visit: Payer: Self-pay

## 2021-05-28 DIAGNOSIS — D508 Other iron deficiency anemias: Secondary | ICD-10-CM

## 2021-05-28 DIAGNOSIS — E1165 Type 2 diabetes mellitus with hyperglycemia: Secondary | ICD-10-CM

## 2021-05-28 LAB — COMPLETE METABOLIC PANEL WITH GFR
AG Ratio: 1.1 (calc) (ref 1.0–2.5)
ALT: 16 U/L (ref 6–29)
AST: 17 U/L (ref 10–30)
Albumin: 4 g/dL (ref 3.6–5.1)
Alkaline phosphatase (APISO): 106 U/L (ref 31–125)
BUN: 11 mg/dL (ref 7–25)
CO2: 26 mmol/L (ref 20–32)
Calcium: 9.7 mg/dL (ref 8.6–10.2)
Chloride: 100 mmol/L (ref 98–110)
Creat: 0.76 mg/dL (ref 0.50–0.97)
Globulin: 3.7 g/dL (calc) (ref 1.9–3.7)
Glucose, Bld: 323 mg/dL — ABNORMAL HIGH (ref 65–99)
Potassium: 4.3 mmol/L (ref 3.5–5.3)
Sodium: 135 mmol/L (ref 135–146)
Total Bilirubin: 0.2 mg/dL (ref 0.2–1.2)
Total Protein: 7.7 g/dL (ref 6.1–8.1)
eGFR: 105 mL/min/{1.73_m2} (ref >=60)

## 2021-05-28 LAB — CBC WITH DIFFERENTIAL/PLATELET
Absolute Monocytes: 486 cells/uL (ref 200–950)
Basophils Absolute: 81 cells/uL (ref 0–200)
Basophils Relative: 1.5 %
Eosinophils Absolute: 49 cells/uL (ref 15–500)
Eosinophils Relative: 0.9 %
HCT: 34.4 % — ABNORMAL LOW (ref 35.0–45.0)
Hemoglobin: 10.4 g/dL — ABNORMAL LOW (ref 11.7–15.5)
Lymphs Abs: 2419 cells/uL (ref 850–3900)
MCH: 23.9 pg — ABNORMAL LOW (ref 27.0–33.0)
MCHC: 30.2 g/dL — ABNORMAL LOW (ref 32.0–36.0)
MCV: 78.9 fL — ABNORMAL LOW (ref 80.0–100.0)
MPV: 11.3 fL (ref 7.5–12.5)
Monocytes Relative: 9 %
Neutro Abs: 2365 cells/uL (ref 1500–7800)
Neutrophils Relative %: 43.8 %
Platelets: 304 10*3/uL (ref 140–400)
RBC: 4.36 10*6/uL (ref 3.80–5.10)
RDW: 15 % (ref 11.0–15.0)
Total Lymphocyte: 44.8 %
WBC: 5.4 10*3/uL (ref 3.8–10.8)

## 2021-05-28 LAB — LIPID PANEL
Cholesterol: 153 mg/dL (ref <200)
HDL: 44 mg/dL — ABNORMAL LOW (ref >=50)
LDL Cholesterol (Calc): 86 mg/dL (calc)
Non-HDL Cholesterol (Calc): 109 mg/dL (calc) (ref <130)
Total CHOL/HDL Ratio: 3.5 (calc) (ref <5.0)
Triglycerides: 133 mg/dL (ref <150)

## 2021-05-28 LAB — HEMOGLOBIN A1C
Hgb A1c MFr Bld: 11.3 % of total Hgb — ABNORMAL HIGH (ref <5.7)
Mean Plasma Glucose: 278 mg/dL
eAG (mmol/L): 15.4 mmol/L

## 2021-05-28 LAB — IRON,TIBC AND FERRITIN PANEL
%SAT: 5 % (calc) — ABNORMAL LOW (ref 16–45)
Ferritin: 5 ng/mL — ABNORMAL LOW (ref 16–154)
Iron: 21 ug/dL — ABNORMAL LOW (ref 40–190)
TIBC: 454 mcg/dL (calc) — ABNORMAL HIGH (ref 250–450)

## 2021-05-28 LAB — GASTROINTESTINAL PATHOGEN PNL

## 2021-05-28 MED ORDER — PIOGLITAZONE HCL 15 MG PO TABS: 15.0000 mg | ORAL_TABLET | Freq: Every day | ORAL | 0 refills | Status: DC

## 2021-05-28 MED ORDER — METFORMIN HCL 500 MG PO TABS: 500.0000 mg | ORAL_TABLET | Freq: Two times a day (BID) | ORAL | 0 refills | Status: DC

## 2021-06-07 DIAGNOSIS — D509 Iron deficiency anemia, unspecified: Secondary | ICD-10-CM | POA: Insufficient documentation

## 2021-06-07 NOTE — Progress Notes (Signed)
?[name] Wolf  ?[phone] [phone] ? ?ID: [name] Wolf OB: 08-03-1985  MR#: [id number]  JOI#:[id number] ? ?Patient Care Team: ?Bo Merino, FNP as PCP - General (Nurse Practitioner) ? ?CHIEF COMPLAINT: Iron deficiency anemia. ? ?INTERVAL HISTORY: Patient is a 36 year old female who was noted to have a significantly decreased hemoglobin and iron stores.  She cannot tolerate oral iron supplementation secondary to constipation.  She does not complain of any weakness or fatigue today.  She has no neurologic complaints.  She denies any recent fevers.  She has good appetite and denies weight loss.  She has no chest pain, shortness of breath, cough, or hemoptysis.  She denies any nausea, vomiting, constipation, or diarrhea.  She has no urinary complaints.  Patient offers no further specific complaints today. ? ?REVIEW OF SYSTEMS:   ?Review of Systems  ?Constitutional: Negative.  Negative for fever, malaise/fatigue and weight loss.  ?Respiratory: Negative.  Negative for cough, hemoptysis and shortness of breath.   ?Cardiovascular: Negative.  Negative for chest pain and leg swelling.  ?Gastrointestinal: Negative.  Negative for abdominal pain, blood in stool and melena.  ?Genitourinary: Negative.  Negative for hematuria.  ?Musculoskeletal: Negative.  Negative for back pain.  ?Skin: Negative.  Negative for rash.  ?Neurological: Negative.  Negative for dizziness, focal weakness, weakness and headaches.  ?Psychiatric/Behavioral: Negative.  The patient is not nervous/anxious.   ? ?As per HPI. Otherwise, a complete review of systems is negative. ? ?PAST MEDICAL HISTORY: ?Past Medical History:  ?Diagnosis Date  ? Abnormal Pap smear of cervix 02/26/21 neg HPV+ 03/03/2021  ? Gestational diabetes   ? Hx of gallstones 59ys old  ? LAP removal of stones  ? Obesity, Class III, BMI 40-49.9 (morbid obesity) ([place])   ? Pre-diabetes   ? ? ?PAST SURGICAL HISTORY: ?Past Surgical History:   ?Procedure Laterality Date  ? LAPAROSCOPY  36 yo  ? gallstones removed  ? ? ?FAMILY HISTORY: ?Family History  ?Problem Relation Age of Onset  ? Hypertension Mother   ? Heart failure Mother   ? Hypertension Sister   ? Diabetes Sister   ? Migraines Sister   ? Diabetes Daughter   ? ? ?ADVANCED DIRECTIVES (Y/N):  N ? ?HEALTH MAINTENANCE: ?Social History  ? ?Tobacco Use  ? Smoking status: Every Day  ?  Packs/day: 0.25  ?  Types: Cigarettes, E-cigarettes  ?  Last attempt to quit: 02/20/2021  ?  Years since quitting: 0.2  ?  Passive exposure: Never  ? Smokeless tobacco: Never  ?Vaping Use  ? Vaping Use: Every day  ? Substances: Nicotine, Flavoring  ?Substance Use Topics  ? Alcohol use: Yes  ?  Alcohol/week: 5.0 standard drinks  ?  Types: 2 Cans of beer, 3 Shots of liquor per week  ?  Comment: once a month  ? Drug use: Not Currently  ?  Types: Marijuana  ?  Comment: last use 12/21/20  ? ? ? Colonoscopy: ? PAP: ? Bone density: ? Lipid panel: ? ?No Known Allergies ? ?Current Outpatient Medications  ?Medication Sig Dispense Refill  ? blood glucose meter kit and supplies KIT Inject 1 each into the skin as directed. Dispense based on patient and insurance preference. Use up to four times daily as directed. (FOR ICD-9 250.00, 250.01). please give supplies approved by her insurance 1 each 0  ? metFORMIN (GLUCOPHAGE) 500 MG tablet Take 1 tablet (500 mg total) by mouth 2 (two) times daily with a meal. 180 tablet 0  ?  pioglitazone (ACTOS) 15 MG tablet Take 1 tablet (15 mg total) by mouth daily. 90 tablet 0  ? ?No current facility-administered medications for this visit.  ? ? ?OBJECTIVE: ?Vitals:  ? 06/08/21 1431  ?BP: 128/86  ?Pulse: (!) 102  ?Resp: 16  ?Temp: (!) 97.2 ?F (36.2 ?C)  ?SpO2: 100%  ?   Body mass index is 38.59 kg/m?[name]    ECOG FS:0 - Asymptomatic ? ?General: Well-developed, well-nourished, no acute distress. ?Eyes: Pink conjunctiva, anicteric sclera. ?HEENT: Normocephalic, moist mucous membranes. ?Lungs: No audible  wheezing or coughing. ?Heart: Regular rate and rhythm. ?Abdomen: Soft, nontender, no obvious distention. ?Musculoskeletal: No edema, cyanosis, or clubbing. ?Neuro: Alert, answering all questions appropriately. Cranial nerves grossly intact. ?Skin: No rashes or petechiae noted. ?Psych: Normal affect. ?Lymphatics: No cervical, calvicular, axillary or inguinal LAD. ? ? ?LAB RESULTS: ? ?Lab Results  ?Component Value Date  ? NA 135 05/27/2021  ? K 4.3 05/27/2021  ? CL 100 05/27/2021  ? CO2 26 05/27/2021  ? GLUCOSE 323 (H) 05/27/2021  ? BUN 11 05/27/2021  ? CREATININE 0.76 05/27/2021  ? CALCIUM 9.7 05/27/2021  ? PROT 7.7 05/27/2021  ? ALBUMIN 3.5 04/11/2020  ? AST 17 05/27/2021  ? ALT 16 05/27/2021  ? ALKPHOS 76 04/11/2020  ? BILITOT 0.2 05/27/2021  ? GFRNONAA >60 04/11/2020  ? GFRAA >60 06/02/2019  ? ? ?Lab Results  ?Component Value Date  ? WBC 5.4 05/27/2021  ? NEUTROABS 2,365 05/27/2021  ? HGB 10.4 (L) 05/27/2021  ? HCT 34.4 (L) 05/27/2021  ? MCV 78.9 (L) 05/27/2021  ? PLT 304 05/27/2021  ? ?Lab Results  ?Component Value Date  ? IRON 21 (L) 05/27/2021  ? TIBC 454 (H) 05/27/2021  ? IRONPCTSAT 5 (L) 05/27/2021  ? ?Lab Results  ?Component Value Date  ? FERRITIN 5 (L) 05/27/2021  ? ? ?STUDIES: ?No results found. ? ?ASSESSMENT: Iron deficiency anemia. ? ?PLAN:   ? ?Iron deficiency anemia: Patient's hemoglobin is reduced to 10.4 and her iron stores are significantly decreased.  She cannot tolerate oral iron supplementation.  Patient will return to clinic 5 times over the next 3 weeks to receive 200 mg of IV Venofer.  She will then return to clinic in 4 months with repeat laboratory work, further evaluation, and continuation of treatment if needed. ? ?I spent a total of 45 minutes reviewing chart data, face-to-face evaluation with the patient, counseling and coordination of care as detailed above. ? ? ?Patient expressed understanding and was in agreement with this plan. She also understands that She can call clinic at any  time with any questions, concerns, or complaints.  ? ? ?Lloyd Huger, MD   06/08/2021 4:22 PM ? ? ? ? ?

## 2021-06-08 ENCOUNTER — Inpatient Hospital Stay: Payer: Medicaid Other

## 2021-06-08 ENCOUNTER — Encounter: Payer: Self-pay | Admitting: Oncology

## 2021-06-08 ENCOUNTER — Inpatient Hospital Stay: Payer: Medicaid Other | Attending: Oncology | Admitting: Oncology

## 2021-06-08 DIAGNOSIS — D509 Iron deficiency anemia, unspecified: Secondary | ICD-10-CM | POA: Insufficient documentation

## 2021-06-08 DIAGNOSIS — F17210 Nicotine dependence, cigarettes, uncomplicated: Secondary | ICD-10-CM | POA: Insufficient documentation

## 2021-06-14 ENCOUNTER — Inpatient Hospital Stay: Payer: Medicaid Other

## 2021-06-14 VITALS — BP 124/93 | HR 92 | Temp 97.8°F | Resp 20

## 2021-06-14 DIAGNOSIS — D509 Iron deficiency anemia, unspecified: Secondary | ICD-10-CM | POA: Diagnosis not present

## 2021-06-14 MED ORDER — IRON SUCROSE 20 MG/ML IV SOLN
200.0000 mg | Freq: Once | INTRAVENOUS | Status: AC
  Administered 2021-06-14: 200 mg via INTRAVENOUS
  Filled 2021-06-14: qty 10

## 2021-06-14 MED ORDER — SODIUM CHLORIDE 0.9 % IV SOLN: 200.0000 mg | Freq: Once | INTRAVENOUS | Status: DC

## 2021-06-14 MED ORDER — SODIUM CHLORIDE 0.9 % IV SOLN
Freq: Once | INTRAVENOUS | Status: AC
  Filled 2021-06-14: qty 250

## 2021-06-14 NOTE — Patient Instructions (Signed)

## 2021-06-16 ENCOUNTER — Inpatient Hospital Stay: Payer: Medicaid Other

## 2021-06-16 VITALS — BP 109/90 | HR 90 | Temp 97.3°F | Resp 18

## 2021-06-16 DIAGNOSIS — D509 Iron deficiency anemia, unspecified: Secondary | ICD-10-CM | POA: Diagnosis not present

## 2021-06-16 MED ORDER — SODIUM CHLORIDE 0.9 % IV SOLN: 200.0000 mg | Freq: Once | INTRAVENOUS | Status: DC

## 2021-06-16 MED ORDER — IRON SUCROSE 20 MG/ML IV SOLN
200.0000 mg | Freq: Once | INTRAVENOUS | Status: AC
  Administered 2021-06-16: 200 mg via INTRAVENOUS
  Filled 2021-06-16: qty 10

## 2021-06-16 MED ORDER — SODIUM CHLORIDE 0.9 % IV SOLN
Freq: Once | INTRAVENOUS | Status: AC
  Filled 2021-06-16: qty 250

## 2021-06-21 ENCOUNTER — Inpatient Hospital Stay: Payer: Medicaid Other | Attending: Oncology

## 2021-06-21 DIAGNOSIS — D509 Iron deficiency anemia, unspecified: Secondary | ICD-10-CM | POA: Insufficient documentation

## 2021-06-23 ENCOUNTER — Encounter: Payer: Self-pay | Admitting: Oncology

## 2021-06-24 ENCOUNTER — Inpatient Hospital Stay: Payer: Medicaid Other

## 2021-06-28 ENCOUNTER — Inpatient Hospital Stay: Payer: Medicaid Other

## 2021-06-30 ENCOUNTER — Ambulatory Visit: Payer: Medicaid Other | Admitting: Licensed Clinical Social Worker

## 2021-06-30 NOTE — Addendum Note (Signed)
Addended by: Cletis Media on: 06/30/2021 12:33 PM ? ? Modules accepted: Orders ? ?

## 2021-07-02 ENCOUNTER — Encounter: Payer: Self-pay | Admitting: Oncology

## 2021-07-02 ENCOUNTER — Inpatient Hospital Stay: Payer: Medicaid Other

## 2021-07-02 VITALS — BP 135/90 | HR 105 | Resp 18

## 2021-07-02 DIAGNOSIS — D509 Iron deficiency anemia, unspecified: Secondary | ICD-10-CM

## 2021-07-02 MED ORDER — SODIUM CHLORIDE 0.9 % IV SOLN
Freq: Once | INTRAVENOUS | Status: AC
  Filled 2021-07-02: qty 250

## 2021-07-02 MED ORDER — SODIUM CHLORIDE 0.9 % IV SOLN
200.0000 mg | Freq: Once | INTRAVENOUS | Status: DC
  Filled 2021-07-02: qty 10

## 2021-07-02 MED ORDER — IRON SUCROSE 20 MG/ML IV SOLN
200.0000 mg | Freq: Once | INTRAVENOUS | Status: AC
  Administered 2021-07-02: 200 mg via INTRAVENOUS
  Filled 2021-07-02: qty 10

## 2021-07-02 NOTE — Patient Instructions (Signed)
[facility]  Discharge Instructions: ?Thank you for choosing [facility] to provide your oncology and hematology care.  ?If you have a lab appointment with the Cancer Center, please go directly to the Cancer Center and check in at the registration area. ? ?Wear comfortable clothing and clothing appropriate for easy access to any Portacath or PICC line.  ? ?We strive to give you quality time with your provider. You may need to reschedule your appointment if you arrive late (15 or more minutes).  Arriving late affects you and other patients whose appointments are after yours.  Also, if you miss three or more appointments without notifying the office, you may be dismissed from the clinic at the provider?s discretion.    ?  ?For prescription refill requests, have your pharmacy contact our office and allow 72 hours for refills to be completed.   ? ?Today you received the following chemotherapy and/or immunotherapy agents VENOFER ?    ?  ?To help prevent nausea and vomiting after your treatment, we encourage you to take your nausea medication as directed. ? ?BELOW ARE SYMPTOMS THAT SHOULD BE REPORTED IMMEDIATELY: ?*FEVER GREATER THAN 100.4 F (38 ?C) OR HIGHER ?*CHILLS OR SWEATING ?*NAUSEA AND VOMITING THAT IS NOT CONTROLLED WITH YOUR NAUSEA MEDICATION ?*UNUSUAL SHORTNESS OF BREATH ?*UNUSUAL BRUISING OR BLEEDING ?*URINARY PROBLEMS (pain or burning when urinating, or frequent urination) ?*BOWEL PROBLEMS (unusual diarrhea, constipation, pain near the anus) ?TENDERNESS IN MOUTH AND THROAT WITH OR WITHOUT PRESENCE OF ULCERS (sore throat, sores in mouth, or a toothache) ?UNUSUAL RASH, SWELLING OR PAIN  ?UNUSUAL VAGINAL DISCHARGE OR ITCHING  ? ?Items with * indicate a potential emergency and should be followed up as soon as possible or go to the Emergency Department if any problems should occur. ? ?Please show the CHEMOTHERAPY ALERT CARD or IMMUNOTHERAPY ALERT CARD at check-in to  the Emergency Department and triage nurse. ? ?Should you have questions after your visit or need to cancel or reschedule your appointment, please contact [facility]  [phone] and follow the prompts.  Office hours are 8:00 a.m. to 4:30 p.m. Monday - Friday. Please note that voicemails left after 4:00 p.m. may not be returned until the following business day.  We are closed weekends and major holidays. You have access to a nurse at all times for urgent questions. Please call the main number to the clinic [phone] and follow the prompts. ? ?For any non-urgent questions, you may also contact your provider using MyChart. We now offer e-Visits for anyone 18 and older to request care online for non-urgent symptoms. For details visit [email or web address]. ?  ?Also download the MyChart app! Go to the app store, search "MyChart", open the app, select [facility], and log in with your MyChart username and password. ? ?Due to Covid, a mask is required upon entering the hospital/clinic. If you do not have a mask, one will be given to you upon arrival. For doctor visits, patients may have 1 support person aged 18 or older with them. For treatment visits, patients cannot have anyone with them due to current Covid guidelines and our immunocompromised population.  ? ?Iron Sucrose Injection ?What is this medication? ?IRON SUCROSE (EYE ern SOO krose) treats low levels of iron (iron deficiency anemia) in people with kidney disease. Iron is a mineral that plays an important role in making red blood cells, which carry oxygen from your lungs to the rest of your body. ?This medicine   may be used for other purposes; ask your health care provider or pharmacist if you have questions. ?COMMON BRAND NAME(S): Venofer ?What should I tell my care team before I take this medication? ?They need to know if you have any of these conditions: ?Anemia not caused by low iron levels ?Heart disease ?High levels of  iron in the blood ?Kidney disease ?Liver disease ?An unusual or allergic reaction to iron, other medications, foods, dyes, or preservatives ?Pregnant or trying to get pregnant ?Breast-feeding ?How should I use this medication? ?This medication is for infusion into a vein. It is given in a hospital or clinic setting. ?Talk to your care team about the use of this medication in children. While this medication may be prescribed for children as young as 2 years for selected conditions, precautions do apply. ?Overdosage: If you think you have taken too much of this medicine contact a poison control center or emergency room at once. ?NOTE: This medicine is only for you. Do not share this medicine with others. ?What if I miss a dose? ?It is important not to miss your dose. Call your care team if you are unable to keep an appointment. ?What may interact with this medication? ?Do not take this medication with any of the following: ?Deferoxamine ?Dimercaprol ?Other iron products ?This medication may also interact with the following: ?Chloramphenicol ?Deferasirox ?This list may not describe all possible interactions. Give your health care provider a list of all the medicines, herbs, non-prescription drugs, or dietary supplements you use. Also tell them if you smoke, drink alcohol, or use illegal drugs. Some items may interact with your medicine. ?What should I watch for while using this medication? ?Visit your care team regularly. Tell your care team if your symptoms do not start to get better or if they get worse. You may need blood work done while you are taking this medication. ?You may need to follow a special diet. Talk to your care team. Foods that contain iron include: whole grains/cereals, dried fruits, beans, or peas, leafy green vegetables, and organ meats (liver, kidney). ?What side effects may I notice from receiving this medication? ?Side effects that you should report to your care team as soon as  possible: ?Allergic reactions--skin rash, itching, hives, swelling of the face, lips, tongue, or throat ?Low blood pressure--dizziness, feeling faint or lightheaded, blurry vision ?Shortness of breath ?Side effects that usually do not require medical attention (report to your care team if they continue or are bothersome): ?Flushing ?Headache ?Joint pain ?Muscle pain ?Nausea ?Pain, redness, or irritation at injection site ?This list may not describe all possible side effects. Call your doctor for medical advice about side effects. You may report side effects to FDA at 1-800-FDA-1088. ?Where should I keep my medication? ?This medication is given in a hospital or clinic and will not be stored at home. ?NOTE: This sheet is a summary. It may not cover all possible information. If you have questions about this medicine, talk to your doctor, pharmacist, or health care provider. ?? 2023 Elsevier/Gold Standard (2020-07-03 00:00:00) ? ?

## 2021-07-05 ENCOUNTER — Inpatient Hospital Stay: Payer: Medicaid Other

## 2021-07-05 DIAGNOSIS — J301 Allergic rhinitis due to pollen: Secondary | ICD-10-CM | POA: Diagnosis not present

## 2021-07-05 DIAGNOSIS — Z03818 Encounter for observation for suspected exposure to other biological agents ruled out: Secondary | ICD-10-CM | POA: Diagnosis not present

## 2021-07-05 DIAGNOSIS — J029 Acute pharyngitis, unspecified: Secondary | ICD-10-CM | POA: Diagnosis not present

## 2021-07-05 DIAGNOSIS — E119 Type 2 diabetes mellitus without complications: Secondary | ICD-10-CM | POA: Diagnosis not present

## 2021-07-07 ENCOUNTER — Ambulatory Visit (INDEPENDENT_AMBULATORY_CARE_PROVIDER_SITE_OTHER): Payer: Medicaid Other | Admitting: Obstetrics and Gynecology

## 2021-07-07 ENCOUNTER — Encounter: Payer: Self-pay | Admitting: Obstetrics and Gynecology

## 2021-07-07 VITALS — BP 120/80 | Ht 62.0 in | Wt 213.0 lb

## 2021-07-07 DIAGNOSIS — N814 Uterovaginal prolapse, unspecified: Secondary | ICD-10-CM | POA: Diagnosis not present

## 2021-07-07 NOTE — Progress Notes (Signed)
36 yo P2 referred for the evaluation of pelvic floor prolapse. Patient reports a monthly period lasing 5 days. She denies any history of incontinence. She admits to some constipation. She reports feeling a bulge when she wipes and was told by her PCP that she had a prolapsed cervix. Patient denies pelvic pain or abnormal discharge. ? ?Past Medical History:  ?Diagnosis Date  ? Abnormal Pap smear of cervix 02/26/21 neg HPV+ 03/03/2021  ? Gestational diabetes   ? Hx of gallstones 60ys old  ? LAP removal of stones  ? Obesity, Class III, BMI 40-49.9 (morbid obesity) ([place])   ? Pre-diabetes   ? ?Past Surgical History:  ?Procedure Laterality Date  ? LAPAROSCOPY  36 yo  ? gallstones removed  ? ?Family History  ?Problem Relation Age of Onset  ? Hypertension Mother   ? Heart failure Mother   ? Hypertension Sister   ? Diabetes Sister   ? Migraines Sister   ? Diabetes Daughter   ? ?Social History  ? ?Tobacco Use  ? Smoking status: Every Day  ?  Packs/day: 0.25  ?  Types: Cigarettes, E-cigarettes  ?  Last attempt to quit: 02/20/2021  ?  Years since quitting: 0.3  ?  Passive exposure: Never  ? Smokeless tobacco: Never  ?Vaping Use  ? Vaping Use: Every day  ? Substances: Nicotine, Flavoring  ?Substance Use Topics  ? Alcohol use: Yes  ?  Alcohol/week: 5.0 standard drinks  ?  Types: 2 Cans of beer, 3 Shots of liquor per week  ?  Comment: once a month  ? Drug use: Not Currently  ?  Types: Marijuana  ?  Comment: last use 12/21/20  ? ?ROS ?See pertinent in HPI. All other systems reviewed and non contributory ?Blood pressure 120/80, height 5\' 2"  (1.575 m), weight 213 lb (96.6 kg), last menstrual period 06/24/2021. ?GENERAL: Well-developed, well-nourished female in no acute distress.  ?ABDOMEN: Soft, nontender, nondistended. No organomegaly. ?PELVIC: Normal external female genitalia. Vagina is pink and rugated.  Normal discharge. Normal appearing cervix. Uterus is normal in size with small degree of prolapse. No adnexal mass or tenderness.  Chaperone present during the pelvic exam ?EXTREMITIES: No cyanosis, clubbing, or edema, 2+ distal pulses. ? ?A/P 36 yo P2 with uterine prolapse ?- Patient will be referred to urogynecology ?- discussed increasing water and fiber intake to help with constipation ?- Patient had pap smear 02/2021 ?

## 2021-07-08 ENCOUNTER — Inpatient Hospital Stay: Payer: Medicaid Other

## 2021-07-12 ENCOUNTER — Inpatient Hospital Stay: Payer: Medicaid Other

## 2021-07-21 ENCOUNTER — Ambulatory Visit: Payer: Medicaid Other | Admitting: Licensed Clinical Social Worker
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
## 2021-08-25 ENCOUNTER — Encounter: Payer: Self-pay | Admitting: Oncology

## 2021-09-01 ENCOUNTER — Other Ambulatory Visit: Payer: Self-pay

## 2021-09-01 ENCOUNTER — Encounter: Payer: Self-pay | Admitting: Nurse Practitioner

## 2021-09-01 ENCOUNTER — Ambulatory Visit (INDEPENDENT_AMBULATORY_CARE_PROVIDER_SITE_OTHER): Payer: Medicaid Other | Admitting: Nurse Practitioner

## 2021-09-01 VITALS — BP 118/72 | HR 100 | Temp 98.2°F | Resp 18 | Ht 62.0 in | Wt 211.9 lb

## 2021-09-01 DIAGNOSIS — E1165 Type 2 diabetes mellitus with hyperglycemia: Secondary | ICD-10-CM

## 2021-09-01 DIAGNOSIS — N812 Incomplete uterovaginal prolapse: Secondary | ICD-10-CM

## 2021-09-01 DIAGNOSIS — R197 Diarrhea, unspecified: Secondary | ICD-10-CM

## 2021-09-01 DIAGNOSIS — D508 Other iron deficiency anemias: Secondary | ICD-10-CM

## 2021-09-01 MED ORDER — LANCETS MISC: 5 refills | Status: DC

## 2021-09-01 MED ORDER — BLOOD GLUCOSE MONITOR KIT: PACK | 0 refills | Status: DC

## 2021-09-01 MED ORDER — PIOGLITAZONE HCL 15 MG PO TABS: 15.0000 mg | ORAL_TABLET | Freq: Every day | ORAL | 0 refills | Status: DC

## 2021-09-01 MED ORDER — BLOOD GLUCOSE TEST VI STRP: ORAL_STRIP | 5 refills | Status: DC

## 2021-09-01 NOTE — Progress Notes (Addendum)
BP 118/72   Pulse 100   Temp 98.2 F (36.8 C) (Oral)   Resp 18   Ht _0  (1.575 m)   Wt 211 lb 14.4 oz (96.1 kg)   SpO2 98%   BMI 38.76 kg/m    Subjective:    Patient ID: [name] Wolf, female    DOB: 17-Feb-1986, 36 y.o.   MRN: [id number]  HPI: [name] Wolf is a 36 y.o. female  Chief Complaint  Patient presents with   Diabetes    3 month follow up   Upon arrival to the office patient was informed that her medicaid was inactive.  Patient is trying to find out what she needs to do to get it reactivated.  Labs were ordered but not completed due to financial reasons. Patient will come by and get labs done when she gets her medicaid reactivated.   DM2: She was diagnosed in 2017.  She is currently taking Actos 15 mg daily. She is not taking the metformin at all. She says she could not tolerate it.  She says that it was causing a lot diarrhea and abdominal cramping.  Her last A1c was 11.3 on 05/27/2021.  She says she was unable check her sugar because she did not have test strips.  Denies any polyuria, polydipsia or polyphasia.  She is due for her foot exam and urine microalbumin.    Iron deficiency anemia: Patient states she has been dealing with iron deficiency anemia for a while.  Patient states she is unable to tolerate iron supplements because they cause constipation.  She is now being seen by heme-onc for iron infusions.   Cervical prolapse: Her last appointment she discussed that she had had an appointment with the health department and she had a prolapsed cervix.  She says she had noticed it several months ago.  We placed a referral to GYN.  She saw GYN on 07/07/2021.  She saw Dr. Elly Modena.  From there she was referred to urogynecology.   Diarrhea: At last appointment patient discussed that she had had diarrhea for several months.  She denied abdominal pain, nausea or vomiting.  She had tried Pepto-Bismol and Kaopectate with no improvement in the diarrhea we did get labs  there was an order put in for a stool culture but was never obtained.  She says this has resolved.      09/01/2021    8:29 AM 05/27/2021   10:45 AM 02/26/2021    3:58 PM 06/06/2017   12:08 PM 05/25/2015    1:23 PM  Depression screen PHQ 2/9  Decreased Interest 0 0 0 0 0  Down, Depressed, Hopeless 0 0 1 1 0  PHQ - 2 Score 0 0 1 1 0  Altered sleeping   2    Tired, decreased energy   1    Change in appetite   2    Feeling bad or failure about yourself    1    Trouble concentrating   0    Moving slowly or fidgety/restless   0    Suicidal thoughts   0    PHQ-9 Score   7    Difficult doing work/chores   Not difficult at all      Relevant past medical, surgical, family and social history reviewed and updated as indicated. Interim medical history since our last visit reviewed. Allergies and medications reviewed and updated.  Review of Systems  Constitutional: Negative for fever or weight change.  Respiratory: Negative for cough  and shortness of breath.   Cardiovascular: Negative for chest pain or palpitations.  Gastrointestinal: Negative for abdominal pain, no bowel changes.  Musculoskeletal: Negative for gait problem or joint swelling.  Skin: Negative for rash.  Neurological: Negative for dizziness or headache.  No other specific complaints in a complete review of systems (except as listed in HPI above).      Objective:    BP 118/72   Pulse 100   Temp 98.2 F (36.8 C) (Oral)   Resp 18   Ht _0  (1.575 m)   Wt 211 lb 14.4 oz (96.1 kg)   SpO2 98%   BMI 38.76 kg/m   Wt Readings from Last 3 Encounters:  09/01/21 211 lb 14.4 oz (96.1 kg)  07/07/21 213 lb (96.6 kg)  06/08/21 211 lb (95.7 kg)    Physical Exam  Constitutional: Patient appears well-developed and well-nourished. Obese No distress.  HEENT: head atraumatic, normocephalic, pupils equal and reactive to light, neck supple, throat within normal limits Cardiovascular: Normal rate, regular rhythm and normal heart sounds.   No murmur heard. No BLE edema. Pulmonary/Chest: Effort normal and breath sounds normal. No respiratory distress. Abdominal: Soft.  There is no tenderness. Psychiatric: Patient has a normal mood and affect. behavior is normal. Judgment and thought content normal.  Diabetic Foot Exam - Simple   Simple Foot Form Diabetic Foot exam was performed with the following findings: Yes 09/01/2021  8:47 AM  Visual Inspection No deformities, no ulcerations, no other skin breakdown bilaterally: Yes Sensation Testing Intact to touch and monofilament testing bilaterally: Yes Pulse Check Posterior Tibialis and Dorsalis pulse intact bilaterally: Yes Comments     Results for orders placed or performed in visit on 05/27/21  Lipid panel  Result Value Ref Range   Cholesterol 153 <200 mg/dL   HDL 44 (L) > OR = 50 mg/dL   Triglycerides 133 <150 mg/dL   LDL Cholesterol (Calc) 86 mg/dL (calc)   Total CHOL/HDL Ratio 3.5 <5.0 (calc)   Non-HDL Cholesterol (Calc) 109 <130 mg/dL (calc)  CBC with Differential/Platelet  Result Value Ref Range   WBC 5.4 3.8 - 10.8 Thousand/uL   RBC 4.36 3.80 - 5.10 Million/uL   Hemoglobin 10.4 (L) 11.7 - 15.5 g/dL   HCT 34.4 (L) 35.0 - 45.0 %   MCV 78.9 (L) 80.0 - 100.0 fL   MCH 23.9 (L) 27.0 - 33.0 pg   MCHC 30.2 (L) 32.0 - 36.0 g/dL   RDW 15.0 11.0 - 15.0 %   Platelets 304 140 - 400 Thousand/uL   MPV 11.3 7.5 - 12.5 fL   Neutro Abs 2,365 1,500 - 7,800 cells/uL   Lymphs Abs 2,419 850 - 3,900 cells/uL   Absolute Monocytes 486 200 - 950 cells/uL   Eosinophils Absolute 49 15 - 500 cells/uL   Basophils Absolute 81 0 - 200 cells/uL   Neutrophils Relative % 43.8 %   Total Lymphocyte 44.8 %   Monocytes Relative 9.0 %   Eosinophils Relative 0.9 %   Basophils Relative 1.5 %  COMPLETE METABOLIC PANEL WITH GFR  Result Value Ref Range   Glucose, Bld 323 (H) 65 - 99 mg/dL   BUN 11 7 - 25 mg/dL   Creat 0.76 0.50 - 0.97 mg/dL   eGFR 105 > OR = 60 mL/min/1.39m   BUN/Creatinine  Ratio NOT APPLICABLE 6 - 22 (calc)   Sodium 135 135 - 146 mmol/L   Potassium 4.3 3.5 - 5.3 mmol/L   Chloride 100 98 - 110  mmol/L   CO2 26 20 - 32 mmol/L   Calcium 9.7 8.6 - 10.2 mg/dL   Total Protein 7.7 6.1 - 8.1 g/dL   Albumin 4.0 3.6 - 5.1 g/dL   Globulin 3.7 1.9 - 3.7 g/dL (calc)   AG Ratio 1.1 1.0 - 2.5 (calc)   Total Bilirubin 0.2 0.2 - 1.2 mg/dL   Alkaline phosphatase (APISO) 106 31 - 125 U/L   AST 17 10 - 30 U/L   ALT 16 6 - 29 U/L  Hemoglobin A1c  Result Value Ref Range   Hgb A1c MFr Bld 11.3 (H) <5.7 % of total Hgb   Mean Plasma Glucose 278 mg/dL   eAG (mmol/L) 15.4 mmol/L  Iron, TIBC and Ferritin Panel  Result Value Ref Range   Iron 21 (L) 40 - 190 mcg/dL   TIBC 454 (H) 250 - 450 mcg/dL (calc)   %SAT 5 (L) 16 - 45 % (calc)   Ferritin 5 (L) 16 - 154 ng/mL  Gastrointestinal Pathogen Pnl RT, PCR  Result Value Ref Range   CampyloBacter Group CANCELED       Assessment & Plan:   Problem List Items Addressed This Visit       Endocrine   Uncontrolled diabetes mellitus with hyperglycemia, without long-term current use of insulin ([place]) - Primary    Patient is currently taking actos 15 mg daily.  She says she stopped taking the metformin because it made her feel sick.  Ordered A1C patient will come back and get it drawn due to insurance problems.        Relevant Medications   Glucose Blood (BLOOD GLUCOSE TEST STRIPS) STRP   Lancets MISC   blood glucose meter kit and supplies KIT   pioglitazone (ACTOS) 15 MG tablet   Other Relevant Orders   HM Diabetes Foot Exam (Completed)   Microalbumin / creatinine urine ratio   Hemoglobin A1c     Other   Iron deficiency anemia    Patient is seen heme-onc for her iron infusions.  Patient states that she is having trouble tolerating the iron infusions.  She will discuss with them.      Other Visit Diagnoses     Cervical prolapse       Patient saw GYN and was referred to her urogynecology.   Diarrhea, unspecified type        Diarrhea has resolved.        Follow up plan: Return in about 3 months (around 12/02/2021) for follow up.

## 2021-09-01 NOTE — Assessment & Plan Note (Signed)
Patient is seen heme-onc for her iron infusions.  Patient states that she is having trouble tolerating the iron infusions.  She will discuss with them.

## 2021-09-01 NOTE — Assessment & Plan Note (Signed)
Patient is currently taking actos 15 mg daily.  She says she stopped taking the metformin because it made her feel sick.  Ordered A1C patient will come back and get it drawn due to insurance problems.

## 2021-10-11 ENCOUNTER — Encounter: Payer: Self-pay | Admitting: Oncology

## 2021-10-15 ENCOUNTER — Other Ambulatory Visit: Payer: Self-pay

## 2021-10-15 DIAGNOSIS — D509 Iron deficiency anemia, unspecified: Secondary | ICD-10-CM

## 2021-10-15 NOTE — Addendum Note (Signed)
Addended by: Ashley Royalty A on: 10/15/2021 02:56 PM   Modules accepted: Orders

## 2021-10-18 ENCOUNTER — Inpatient Hospital Stay: Payer: Self-pay | Attending: Nurse Practitioner

## 2021-10-18 MED FILL — Iron Sucrose Inj 20 MG/ML (Fe Equiv): INTRAVENOUS | Qty: 10 | Status: AC

## 2021-10-18 NOTE — Progress Notes (Deleted)
[name] Wolf  [phone] [phone]  ID: [name] Wolf OB: November 29, 1985  MR#: [id number]  QIO#:[id number]  Patient Care Team: Bo Merino, FNP as PCP - General (Nurse Practitioner)  CHIEF COMPLAINT: Iron deficiency anemia.  INTERVAL HISTORY: Patient is a 36 year old female who was noted to have a significantly decreased hemoglobin and iron stores.  She cannot tolerate oral iron supplementation secondary to constipation.  She does not complain of any weakness or fatigue today.  She has no neurologic complaints.  She denies any recent fevers.  She has good appetite and denies weight loss.  She has no chest pain, shortness of breath, cough, or hemoptysis.  She denies any nausea, vomiting, constipation, or diarrhea.  She has no urinary complaints.  Patient offers no further specific complaints today.  REVIEW OF SYSTEMS:   Review of Systems  Constitutional: Negative.  Negative for fever, malaise/fatigue and weight loss.  Respiratory: Negative.  Negative for cough, hemoptysis and shortness of breath.   Cardiovascular: Negative.  Negative for chest pain and leg swelling.  Gastrointestinal: Negative.  Negative for abdominal pain, blood in stool and melena.  Genitourinary: Negative.  Negative for hematuria.  Musculoskeletal: Negative.  Negative for back pain.  Skin: Negative.  Negative for rash.  Neurological: Negative.  Negative for dizziness, focal weakness, weakness and headaches.  Psychiatric/Behavioral: Negative.  The patient is not nervous/anxious.     As per HPI. Otherwise, a complete review of systems is negative.  PAST MEDICAL HISTORY: Past Medical History:  Diagnosis Date   Abnormal Pap smear of cervix 02/26/21 neg HPV+ 03/03/2021   Gestational diabetes    Hx of gallstones 20ys old   LAP removal of stones   Obesity, Class III, BMI 40-49.9 (morbid obesity) ([place])    Pre-diabetes     PAST SURGICAL HISTORY: Past Surgical History:   Procedure Laterality Date   LAPAROSCOPY  36 yo   gallstones removed    FAMILY HISTORY: Family History  Problem Relation Age of Onset   Hypertension Mother    Heart failure Mother    Hypertension Sister    Diabetes Sister    Migraines Sister    Diabetes Daughter     ADVANCED DIRECTIVES (Y/N):  N  HEALTH MAINTENANCE: Social History   Tobacco Use   Smoking status: Every Day    Packs/day: 0.25    Types: Cigarettes, E-cigarettes    Last attempt to quit: 02/20/2021    Years since quitting: 0.6    Passive exposure: Never   Smokeless tobacco: Never  Vaping Use   Vaping Use: Every day   Substances: Nicotine, Flavoring  Substance Use Topics   Alcohol use: Yes    Alcohol/week: 5.0 standard drinks of alcohol    Types: 2 Cans of beer, 3 Shots of liquor per week    Comment: once a month   Drug use: Not Currently    Types: Marijuana    Comment: last use 12/21/20     Colonoscopy:  PAP:  Bone density:  Lipid panel:  No Known Allergies  Current Outpatient Medications  Medication Sig Dispense Refill   blood glucose meter kit and supplies KIT Inject 1 each into the skin as directed. Dispense based on patient and insurance preference. Use up to four times daily as directed. (FOR ICD-9 250.00, 250.01). please give supplies approved by her insurance (Patient not taking: Reported on 09/01/2021) 1 each 0   blood glucose meter kit and supplies KIT Dispense based on patient and insurance preference. Use  up to four times daily as directed. (FOR ICD-9 250.00, 250.01). 1 each 0   Glucose Blood (BLOOD GLUCOSE TEST STRIPS) STRP Use as directed to monitor FSBS once daily for DM 100 strip 5   Lancets MISC Use as directed to monitor FSBS three times a week. Dx: E11.9. 100 each 5   pioglitazone (ACTOS) 15 MG tablet Take 1 tablet (15 mg total) by mouth daily. 90 tablet 0   No current facility-administered medications for this visit.    OBJECTIVE: There were no vitals filed for this visit.     There is no height or weight on file to calculate BMI.    ECOG FS:0 - Asymptomatic  General: Well-developed, well-nourished, no acute distress. Eyes: Pink conjunctiva, anicteric sclera. HEENT: Normocephalic, moist mucous membranes. Lungs: No audible wheezing or coughing. Heart: Regular rate and rhythm. Abdomen: Soft, nontender, no obvious distention. Musculoskeletal: No edema, cyanosis, or clubbing. Neuro: Alert, answering all questions appropriately. Cranial nerves grossly intact. Skin: No rashes or petechiae noted. Psych: Normal affect. Lymphatics: No cervical, calvicular, axillary or inguinal LAD.   LAB RESULTS:  Lab Results  Component Value Date   NA 135 05/27/2021   K 4.3 05/27/2021   CL 100 05/27/2021   CO2 26 05/27/2021   GLUCOSE 323 (H) 05/27/2021   BUN 11 05/27/2021   CREATININE 0.76 05/27/2021   CALCIUM 9.7 05/27/2021   PROT 7.7 05/27/2021   ALBUMIN 3.5 04/11/2020   AST 17 05/27/2021   ALT 16 05/27/2021   ALKPHOS 76 04/11/2020   BILITOT 0.2 05/27/2021   GFRNONAA >60 04/11/2020   GFRAA >60 06/02/2019    Lab Results  Component Value Date   WBC 5.4 05/27/2021   NEUTROABS 2,365 05/27/2021   HGB 10.4 (L) 05/27/2021   HCT 34.4 (L) 05/27/2021   MCV 78.9 (L) 05/27/2021   PLT 304 05/27/2021   Lab Results  Component Value Date   IRON 21 (L) 05/27/2021   TIBC 454 (H) 05/27/2021   IRONPCTSAT 5 (L) 05/27/2021   Lab Results  Component Value Date   FERRITIN 5 (L) 05/27/2021    STUDIES: No results found.  ASSESSMENT: Iron deficiency anemia.  PLAN:    Iron deficiency anemia: Patient's hemoglobin is reduced to 10.4 and her iron stores are significantly decreased.  She cannot tolerate oral iron supplementation.  Patient will return to clinic 5 times over the next 3 weeks to receive 200 mg of IV Venofer.  She will then return to clinic in 4 months with repeat laboratory work, further evaluation, and continuation of treatment if needed.  I spent a total of 45  minutes reviewing chart data, face-to-face evaluation with the patient, counseling and coordination of care as detailed above.   Patient expressed understanding and was in agreement with this plan. She also understands that She can call clinic at any time with any questions, concerns, or complaints.    Lloyd Huger, MD   10/18/2021 11:49 PM

## 2021-10-19 ENCOUNTER — Inpatient Hospital Stay: Payer: Self-pay

## 2021-10-19 ENCOUNTER — Inpatient Hospital Stay: Payer: Self-pay | Admitting: Oncology

## 2021-10-19 DIAGNOSIS — D509 Iron deficiency anemia, unspecified: Secondary | ICD-10-CM

## 2021-10-21 ENCOUNTER — Other Ambulatory Visit (HOSPITAL_COMMUNITY): Payer: Self-pay | Admitting: Pulmonary Disease

## 2021-10-21 ENCOUNTER — Other Ambulatory Visit: Payer: Self-pay | Admitting: Pulmonary Disease

## 2021-10-21 DIAGNOSIS — Z86711 Personal history of pulmonary embolism: Secondary | ICD-10-CM

## 2021-10-21 DIAGNOSIS — J849 Interstitial pulmonary disease, unspecified: Secondary | ICD-10-CM

## 2021-10-21 DIAGNOSIS — I824Z2 Acute embolism and thrombosis of unspecified deep veins of left distal lower extremity: Secondary | ICD-10-CM

## 2021-12-02 ENCOUNTER — Ambulatory Visit: Payer: Self-pay | Admitting: Nurse Practitioner

## 2021-12-02 NOTE — Progress Notes (Deleted)
There were no vitals taken for this visit.   Subjective:    Patient ID: [name] Wolf, female    DOB: 14-Jan-1986, 36 y.o.   MRN: [id number]  HPI: [name] Wolf is a 36 y.o. female  No chief complaint on file.  DM2: Has had diabetes since 2017.  Current medications include Actos 15 mg daily.  Patient was unable to tolerate the metformin.  Causing her a lot of diarrhea and abdominal cramping.  Her last A1c was 11.3 on 05/27/2021.  She reports her blood sugars have been running ***.  She denies any polyphagia, polydipsia or polyuria.  She is up-to-date on her foot exam.  She is due for microalbumin, A1c and eye exam.  Iron deficiency anemia: Patient states she has been dealing with iron deficiency anemia for a while.  Patient states she is unable to tolerate iron supplements because they cause constipation.  She is now being seen by heme-onc for iron infusions.  She last saw hematology on 06/08/2021. Patient reports ***.   Cervical prolapse: Her last appointment she discussed that she had had an appointment with the health department and she had a prolapsed cervix.  She says she had noticed it several months ago.  We placed a referral to GYN.  She saw GYN on 07/07/2021.  She saw Dr. Elly Modena.  From there she was referred to urogynecology. Patient reports ***.        09/01/2021    8:29 AM 05/27/2021   10:45 AM 02/26/2021    3:58 PM 06/06/2017   12:08 PM 05/25/2015    1:23 PM  Depression screen PHQ 2/9  Decreased Interest 0 0 0 0 0  Down, Depressed, Hopeless 0 0 1 1 0  PHQ - 2 Score 0 0 1 1 0  Altered sleeping   2    Tired, decreased energy   1    Change in appetite   2    Feeling bad or failure about yourself    1    Trouble concentrating   0    Moving slowly or fidgety/restless   0    Suicidal thoughts   0    PHQ-9 Score   7    Difficult doing work/chores   Not difficult at all      Relevant past medical, surgical, family and social history reviewed and updated as indicated. Interim  medical history since our last visit reviewed. Allergies and medications reviewed and updated.  Review of Systems  Constitutional: Negative for fever or weight change.  Respiratory: Negative for cough and shortness of breath.   Cardiovascular: Negative for chest pain or palpitations.  Gastrointestinal: Negative for abdominal pain, no bowel changes.  Musculoskeletal: Negative for gait problem or joint swelling.  Skin: Negative for rash.  Neurological: Negative for dizziness or headache.  No other specific complaints in a complete review of systems (except as listed in HPI above).      Objective:    There were no vitals taken for this visit.  Wt Readings from Last 3 Encounters:  09/01/21 211 lb 14.4 oz (96.1 kg)  07/07/21 213 lb (96.6 kg)  06/08/21 211 lb (95.7 kg)    Physical Exam  Constitutional: Patient appears well-developed and well-nourished. Obese No distress.  HEENT: head atraumatic, normocephalic, pupils equal and reactive to light, neck supple, throat within normal limits Cardiovascular: Normal rate, regular rhythm and normal heart sounds.  No murmur heard. No BLE edema. Pulmonary/Chest: Effort normal and breath sounds normal. No respiratory  distress. Abdominal: Soft.  There is no tenderness. Psychiatric: Patient has a normal mood and affect. behavior is normal. Judgment and thought content normal.  Diabetic Foot Exam - Simple   No data filed     Results for orders placed or performed in visit on 05/27/21  Lipid panel  Result Value Ref Range   Cholesterol 153 <200 mg/dL   HDL 44 (L) > OR = 50 mg/dL   Triglycerides 133 <150 mg/dL   LDL Cholesterol (Calc) 86 mg/dL (calc)   Total CHOL/HDL Ratio 3.5 <5.0 (calc)   Non-HDL Cholesterol (Calc) 109 <130 mg/dL (calc)  CBC with Differential/Platelet  Result Value Ref Range   WBC 5.4 3.8 - 10.8 Thousand/uL   RBC 4.36 3.80 - 5.10 Million/uL   Hemoglobin 10.4 (L) 11.7 - 15.5 g/dL   HCT 34.4 (L) 35.0 - 45.0 %   MCV 78.9 (L)  80.0 - 100.0 fL   MCH 23.9 (L) 27.0 - 33.0 pg   MCHC 30.2 (L) 32.0 - 36.0 g/dL   RDW 15.0 11.0 - 15.0 %   Platelets 304 140 - 400 Thousand/uL   MPV 11.3 7.5 - 12.5 fL   Neutro Abs 2,365 1,500 - 7,800 cells/uL   Lymphs Abs 2,419 850 - 3,900 cells/uL   Absolute Monocytes 486 200 - 950 cells/uL   Eosinophils Absolute 49 15 - 500 cells/uL   Basophils Absolute 81 0 - 200 cells/uL   Neutrophils Relative % 43.8 %   Total Lymphocyte 44.8 %   Monocytes Relative 9.0 %   Eosinophils Relative 0.9 %   Basophils Relative 1.5 %  COMPLETE METABOLIC PANEL WITH GFR  Result Value Ref Range   Glucose, Bld 323 (H) 65 - 99 mg/dL   BUN 11 7 - 25 mg/dL   Creat 0.76 0.50 - 0.97 mg/dL   eGFR 105 > OR = 60 mL/min/1.24m   BUN/Creatinine Ratio NOT APPLICABLE 6 - 22 (calc)   Sodium 135 135 - 146 mmol/L   Potassium 4.3 3.5 - 5.3 mmol/L   Chloride 100 98 - 110 mmol/L   CO2 26 20 - 32 mmol/L   Calcium 9.7 8.6 - 10.2 mg/dL   Total Protein 7.7 6.1 - 8.1 g/dL   Albumin 4.0 3.6 - 5.1 g/dL   Globulin 3.7 1.9 - 3.7 g/dL (calc)   AG Ratio 1.1 1.0 - 2.5 (calc)   Total Bilirubin 0.2 0.2 - 1.2 mg/dL   Alkaline phosphatase (APISO) 106 31 - 125 U/L   AST 17 10 - 30 U/L   ALT 16 6 - 29 U/L  Hemoglobin A1c  Result Value Ref Range   Hgb A1c MFr Bld 11.3 (H) <5.7 % of total Hgb   Mean Plasma Glucose 278 mg/dL   eAG (mmol/L) 15.4 mmol/L  Iron, TIBC and Ferritin Panel  Result Value Ref Range   Iron 21 (L) 40 - 190 mcg/dL   TIBC 454 (H) 250 - 450 mcg/dL (calc)   %SAT 5 (L) 16 - 45 % (calc)   Ferritin 5 (L) 16 - 154 ng/mL  Gastrointestinal Pathogen Pnl RT, PCR  Result Value Ref Range   CampyloBacter Group CANCELED       Assessment & Plan:   Problem List Items Addressed This Visit   None    Follow up plan: No follow-ups on file.

## 2021-12-03 ENCOUNTER — Ambulatory Visit: Payer: Self-pay | Admitting: Nurse Practitioner

## 2021-12-03 DIAGNOSIS — N812 Incomplete uterovaginal prolapse: Secondary | ICD-10-CM | POA: Insufficient documentation

## 2021-12-10 ENCOUNTER — Ambulatory Visit: Payer: Self-pay | Admitting: Nurse Practitioner

## 2021-12-10 NOTE — Progress Notes (Deleted)
There were no vitals taken for this visit.   Subjective:    Patient ID: [name] Wolf, female    DOB: January 20, 1986, 36 y.o.   MRN: [id number]  HPI: [name] Wolf is a 36 y.o. female  No chief complaint on file.  DM2: Has had diabetes since 2017.  Current medications include Actos 15 mg daily.  Patient was unable to tolerate the metformin.  Causing her a lot of diarrhea and abdominal cramping.  Her last A1c was 11.3 on 05/27/2021.  She reports her blood sugars have been running ***.  She denies any polyphagia, polydipsia or polyuria.  She is up-to-date on her foot exam.  She is due for microalbumin, A1c and eye exam.  Iron deficiency anemia: Patient states she has been dealing with iron deficiency anemia for a while.  Patient states she is unable to tolerate iron supplements because they cause constipation.  She is now being seen by heme-onc for iron infusions.  She last saw hematology on 06/08/2021. Patient reports ***.   Cervical prolapse: Her last appointment she discussed that she had had an appointment with the health department and she had a prolapsed cervix.  She says she had noticed it several months ago.  We placed a referral to GYN.  She saw GYN on 07/07/2021.  She saw Dr. Elly Modena.  From there she was referred to urogynecology. Patient reports ***.        09/01/2021    8:29 AM 05/27/2021   10:45 AM 02/26/2021    3:58 PM 06/06/2017   12:08 PM 05/25/2015    1:23 PM  Depression screen PHQ 2/9  Decreased Interest 0 0 0 0 0  Down, Depressed, Hopeless 0 0 1 1 0  PHQ - 2 Score 0 0 1 1 0  Altered sleeping   2    Tired, decreased energy   1    Change in appetite   2    Feeling bad or failure about yourself    1    Trouble concentrating   0    Moving slowly or fidgety/restless   0    Suicidal thoughts   0    PHQ-9 Score   7    Difficult doing work/chores   Not difficult at all      Relevant past medical, surgical, family and social history reviewed and updated as indicated. Interim  medical history since our last visit reviewed. Allergies and medications reviewed and updated.  Review of Systems  Constitutional: Negative for fever or weight change.  Respiratory: Negative for cough and shortness of breath.   Cardiovascular: Negative for chest pain or palpitations.  Gastrointestinal: Negative for abdominal pain, no bowel changes.  Musculoskeletal: Negative for gait problem or joint swelling.  Skin: Negative for rash.  Neurological: Negative for dizziness or headache.  No other specific complaints in a complete review of systems (except as listed in HPI above).      Objective:    There were no vitals taken for this visit.  Wt Readings from Last 3 Encounters:  09/01/21 211 lb 14.4 oz (96.1 kg)  07/07/21 213 lb (96.6 kg)  06/08/21 211 lb (95.7 kg)    Physical Exam  Constitutional: Patient appears well-developed and well-nourished. Obese No distress.  HEENT: head atraumatic, normocephalic, pupils equal and reactive to light, neck supple, throat within normal limits Cardiovascular: Normal rate, regular rhythm and normal heart sounds.  No murmur heard. No BLE edema. Pulmonary/Chest: Effort normal and breath sounds normal. No respiratory  distress. Abdominal: Soft.  There is no tenderness. Psychiatric: Patient has a normal mood and affect. behavior is normal. Judgment and thought content normal.  Diabetic Foot Exam - Simple   No data filed     Results for orders placed or performed in visit on 05/27/21  Lipid panel  Result Value Ref Range   Cholesterol 153 <200 mg/dL   HDL 44 (L) > OR = 50 mg/dL   Triglycerides 133 <150 mg/dL   LDL Cholesterol (Calc) 86 mg/dL (calc)   Total CHOL/HDL Ratio 3.5 <5.0 (calc)   Non-HDL Cholesterol (Calc) 109 <130 mg/dL (calc)  CBC with Differential/Platelet  Result Value Ref Range   WBC 5.4 3.8 - 10.8 Thousand/uL   RBC 4.36 3.80 - 5.10 Million/uL   Hemoglobin 10.4 (L) 11.7 - 15.5 g/dL   HCT 34.4 (L) 35.0 - 45.0 %   MCV 78.9 (L)  80.0 - 100.0 fL   MCH 23.9 (L) 27.0 - 33.0 pg   MCHC 30.2 (L) 32.0 - 36.0 g/dL   RDW 15.0 11.0 - 15.0 %   Platelets 304 140 - 400 Thousand/uL   MPV 11.3 7.5 - 12.5 fL   Neutro Abs 2,365 1,500 - 7,800 cells/uL   Lymphs Abs 2,419 850 - 3,900 cells/uL   Absolute Monocytes 486 200 - 950 cells/uL   Eosinophils Absolute 49 15 - 500 cells/uL   Basophils Absolute 81 0 - 200 cells/uL   Neutrophils Relative % 43.8 %   Total Lymphocyte 44.8 %   Monocytes Relative 9.0 %   Eosinophils Relative 0.9 %   Basophils Relative 1.5 %  COMPLETE METABOLIC PANEL WITH GFR  Result Value Ref Range   Glucose, Bld 323 (H) 65 - 99 mg/dL   BUN 11 7 - 25 mg/dL   Creat 0.76 0.50 - 0.97 mg/dL   eGFR 105 > OR = 60 mL/min/1.2m   BUN/Creatinine Ratio NOT APPLICABLE 6 - 22 (calc)   Sodium 135 135 - 146 mmol/L   Potassium 4.3 3.5 - 5.3 mmol/L   Chloride 100 98 - 110 mmol/L   CO2 26 20 - 32 mmol/L   Calcium 9.7 8.6 - 10.2 mg/dL   Total Protein 7.7 6.1 - 8.1 g/dL   Albumin 4.0 3.6 - 5.1 g/dL   Globulin 3.7 1.9 - 3.7 g/dL (calc)   AG Ratio 1.1 1.0 - 2.5 (calc)   Total Bilirubin 0.2 0.2 - 1.2 mg/dL   Alkaline phosphatase (APISO) 106 31 - 125 U/L   AST 17 10 - 30 U/L   ALT 16 6 - 29 U/L  Hemoglobin A1c  Result Value Ref Range   Hgb A1c MFr Bld 11.3 (H) <5.7 % of total Hgb   Mean Plasma Glucose 278 mg/dL   eAG (mmol/L) 15.4 mmol/L  Iron, TIBC and Ferritin Panel  Result Value Ref Range   Iron 21 (L) 40 - 190 mcg/dL   TIBC 454 (H) 250 - 450 mcg/dL (calc)   %SAT 5 (L) 16 - 45 % (calc)   Ferritin 5 (L) 16 - 154 ng/mL  Gastrointestinal Pathogen Pnl RT, PCR  Result Value Ref Range   CampyloBacter Group CANCELED       Assessment & Plan:   Problem List Items Addressed This Visit   None    Follow up plan: No follow-ups on file.

## 2021-12-21 ENCOUNTER — Encounter: Payer: Self-pay | Admitting: Oncology

## 2021-12-28 ENCOUNTER — Ambulatory Visit: Payer: Medicaid Other | Admitting: Obstetrics and Gynecology

## 2022-02-25 ENCOUNTER — Encounter: Payer: Self-pay | Admitting: Oncology

## 2022-03-01 ENCOUNTER — Ambulatory Visit: Payer: Medicaid Other

## 2022-03-02 ENCOUNTER — Ambulatory Visit: Payer: Medicaid Other | Admitting: Family Medicine

## 2022-03-02 ENCOUNTER — Encounter: Payer: Self-pay | Admitting: Family Medicine

## 2022-03-02 ENCOUNTER — Ambulatory Visit: Payer: Medicaid Other

## 2022-03-02 ENCOUNTER — Encounter: Payer: Self-pay | Admitting: Oncology

## 2022-03-02 DIAGNOSIS — Z113 Encounter for screening for infections with a predominantly sexual mode of transmission: Secondary | ICD-10-CM | POA: Diagnosis not present

## 2022-03-02 DIAGNOSIS — B3731 Acute candidiasis of vulva and vagina: Secondary | ICD-10-CM

## 2022-03-02 LAB — WET PREP FOR TRICH, YEAST, CLUE
Trichomonas Exam: NEGATIVE
Yeast Exam: NEGATIVE

## 2022-03-02 LAB — HM HIV SCREENING LAB: HM HIV Screening: NEGATIVE

## 2022-03-02 MED ORDER — CLOTRIMAZOLE-BETAMETHASONE 1-0.05 % EX CREA: 1.0000 | TOPICAL_CREAM | Freq: Every day | CUTANEOUS | 0 refills | Status: AC

## 2022-03-02 NOTE — Progress Notes (Signed)
Pt is here for STD screening.  Wet mount results reviewed.  The patient was dispensed Clotrimazole #1  today. I provided counseling today regarding the medication. We discussed the medication, the side effects and when to call clinic. Patient given the opportunity to ask questions. Questions answered.  Pt declined condoms.  Windle Guard, RN
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
## 2022-03-17 ENCOUNTER — Encounter: Payer: Self-pay | Admitting: Oncology

## 2022-03-17 ENCOUNTER — Ambulatory Visit: Payer: Self-pay | Admitting: *Deleted

## 2022-03-17 NOTE — Telephone Encounter (Signed)
Reason for Disposition  [1] Blood glucose > 300 mg/dL (16.7 mmol/L) AND [2] two or more times in a row  Answer Assessment - Initial Assessment Questions 1. BLOOD GLUCOSE: "What is your blood glucose level?"      375- this morning fasting, presently-343 2. ONSET: "When did you check the blood glucose?"     This morning, 12:47 3. USUAL RANGE: "What is your glucose level usually?" (e.g., usual fasting morning value, usual evening value)     Always 280 or above 4. KETONES: "Do you check for ketones (urine or blood test strips)?" If Yes, ask: "What does the test show now?"      no 5. TYPE 1 or 2:  "Do you know what type of diabetes you have?"  (e.g., Type 1, Type 2, Gestational; doesn't know)      Type 2 6. INSULIN: "Do you take insulin?" "What type of insulin(s) do you use? What is the mode of delivery? (syringe, pen; injection or pump)?"      no 7. DIABETES PILLS: "Do you take any pills for your diabetes?" If Yes, ask: "Have you missed taking any pills recently?"     Not taking 8. OTHER SYMPTOMS: "Do you have any symptoms?" (e.g., fever, frequent urination, difficulty breathing, dizziness, weakness, vomiting)     no  Protocols used: Diabetes - High Blood Sugar-A-AH

## 2022-03-17 NOTE — Telephone Encounter (Signed)
  Chief Complaint: elevated glucose Symptoms: elevated glucose reading- stopped oral medication Frequency: chronic- patient states her fasting level is always over 280 Pertinent Negatives: Patient denies fever, frequent urination, difficulty breathing, dizziness, weakness, vomiting Disposition: [] ED /[] Urgent Care (no appt availability in office) / [] Appointment(In office/virtual)/ []  [facility]/ [] Home Care/ [x] Refused Recommended Disposition /[] [facility]/ []  Follow-up with PCP Additional Notes: Patient advised needs to bring that number down- she declines appointment today due to her schedule. Patient has made appointment for tomorrow morning. Patient advised if she develops symptoms has to be seen at ED

## 2022-03-17 NOTE — Telephone Encounter (Signed)
FYI
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
## 2022-03-18 ENCOUNTER — Ambulatory Visit (INDEPENDENT_AMBULATORY_CARE_PROVIDER_SITE_OTHER): Payer: Medicaid Other | Admitting: Internal Medicine

## 2022-03-18 ENCOUNTER — Other Ambulatory Visit: Payer: Self-pay | Admitting: Internal Medicine

## 2022-03-18 ENCOUNTER — Encounter: Payer: Self-pay | Admitting: Internal Medicine

## 2022-03-18 VITALS — BP 122/88 | HR 110 | Temp 97.8°F | Resp 18 | Ht 62.0 in | Wt 204.4 lb

## 2022-03-18 DIAGNOSIS — Z13220 Encounter for screening for lipoid disorders: Secondary | ICD-10-CM

## 2022-03-18 DIAGNOSIS — Z1159 Encounter for screening for other viral diseases: Secondary | ICD-10-CM

## 2022-03-18 DIAGNOSIS — E1165 Type 2 diabetes mellitus with hyperglycemia: Secondary | ICD-10-CM

## 2022-03-18 MED ORDER — RYBELSUS 7 MG PO TABS: 7.0000 mg | ORAL_TABLET | Freq: Every day | ORAL | 1 refills | Status: DC

## 2022-03-18 MED ORDER — BLOOD GLUCOSE MONITORING SUPPL DEVI: 1.0000 | Freq: Three times a day (TID) | 0 refills | Status: DC

## 2022-03-18 MED ORDER — LANCET DEVICE MISC: 1.0000 | Freq: Three times a day (TID) | 0 refills | Status: AC

## 2022-03-18 MED ORDER — LANCETS MISC. MISC: 1.0000 | Freq: Three times a day (TID) | 0 refills | Status: AC

## 2022-03-18 MED ORDER — METFORMIN HCL ER 500 MG PO TB24: 500.0000 mg | ORAL_TABLET | Freq: Two times a day (BID) | ORAL | 1 refills | Status: DC

## 2022-03-18 MED ORDER — BLOOD GLUCOSE TEST VI STRP: 1.0000 | ORAL_STRIP | Freq: Three times a day (TID) | 0 refills | Status: DC

## 2022-03-18 NOTE — Telephone Encounter (Signed)
Requested medications are due for refill today.  See note  Requested medications are on the active medications list.  yes  Last refill. Ordered 03/18/2022 - not filled  Future visit scheduled.   yes  Notes to clinic.   Pharmacy comment: Alternative Requested:PA REQUIRED.    Off-Protocol Failed01/26/2024 10:05 AM  Protocol Details Medication not assigned to a protocol, review manually.        Requested Prescriptions  Pending Prescriptions Disp Refills   RYBELSUS 7 MG TABS [Pharmacy Med Name: RYBELSUS 7 MG TABLET] 30 tablet 1    Sig: Take 1 tablet (7 mg total) by mouth daily.     Off-Protocol Failed - 03/18/2022 10:05 AM      Failed - Medication not assigned to a protocol, review manually.      Passed - Valid encounter within last 12 months    Recent Outpatient Visits           Today Uncontrolled diabetes mellitus with hyperglycemia, without long-term current use of insulin [facility])   [facility] Teodora Medici, DO   6 months ago Uncontrolled diabetes mellitus with hyperglycemia, without long-term current use of insulin [facility])   [facility] Serafina Royals F, FNP   9 months ago Type 2 diabetes mellitus treated without insulin [facility])   [facility] Bo Merino, FNP       Future Appointments             In 4 weeks Reece Packer, Myna Hidalgo, [facility], [facility]

## 2022-03-18 NOTE — Patient Instructions (Addendum)
It was great seeing you today!  Plan discussed at today's visit: -Blood work ordered today, results will be uploaded to [place].  -Start Metformin extended release 500 mg once a day for 1 week, then start taking it twice a day with meals -I will send a prescription for Rybelsus, we will work on getting this covered for you but if not we will try a once weekly injectable    -Urine test today -Start checking sugar fasting in the morning and once later in the day, write them down and bring to our follow up  -Work on decreasing sugar and carbs in the diet    Follow up in: 1 month   Take care and let us know if you have any questions or concerns prior to your next visit.  Dr. Rosana Berger  Diabetes Mellitus and Nutrition, Adult When you have diabetes, or diabetes mellitus, it is very important to have healthy eating habits because your blood sugar (glucose) levels are greatly affected by what you eat and drink. Eating healthy foods in the right amounts, at about the same times every day, can help you: Manage your blood glucose. Lower your risk of heart disease. Improve your blood pressure. Reach or maintain a healthy weight. What can affect my meal plan? Every person with diabetes is different, and each person has different needs for a meal plan. Your health care provider may recommend that you work with a dietitian to make a meal plan that is best for you. Your meal plan may vary depending on factors such as: The calories you need. The medicines you take. Your weight. Your blood glucose, blood pressure, and cholesterol levels. Your activity level. Other health conditions you have, such as heart or kidney disease. How do carbohydrates affect me? Carbohydrates, also called carbs, affect your blood glucose level more than any other type of food. Eating carbs raises the amount of glucose in your blood. It is important to know how many carbs you can safely have in each meal. This is different for  every person. Your dietitian can help you calculate how many carbs you should have at each meal and for each snack. How does alcohol affect me? Alcohol can cause a decrease in blood glucose (hypoglycemia), especially if you use insulin or take certain diabetes medicines by mouth. Hypoglycemia can be a life-threatening condition. Symptoms of hypoglycemia, such as sleepiness, dizziness, and confusion, are similar to symptoms of having too much alcohol. Do not drink alcohol if: Your health care provider tells you not to drink. You are pregnant, may be pregnant, or are planning to become pregnant. If you drink alcohol: Limit how much you have to: 0-1 drink a day for women. 0-2 drinks a day for men. Know how much alcohol is in your drink. In the [place] one drink equals one 12 oz bottle of beer (355 mL), one 5 oz glass of wine (148 mL), or one 1 oz glass of hard liquor (44 mL). Keep yourself hydrated with water, diet soda, or unsweetened iced tea. Keep in mind that regular soda, juice, and other mixers may contain a lot of sugar and must be counted as carbs. What are tips for following this plan?  Reading food labels Start by checking the serving size on the Nutrition Facts label of packaged foods and drinks. The number of calories and the amount of carbs, fats, and other nutrients listed on the label are based on one serving of the item. Many items contain more than one  serving per package. Check the total grams (g) of carbs in one serving. Check the number of grams of saturated fats and trans fats in one serving. Choose foods that have a low amount or none of these fats. Check the number of milligrams (mg) of salt (sodium) in one serving. Most people should limit total sodium intake to less than 2,300 mg per day. Always check the nutrition information of foods labeled as "low-fat" or "nonfat." These foods may be higher in added sugar or refined carbs and should be avoided. Talk to your dietitian to  identify your daily goals for nutrients listed on the label. Shopping Avoid buying canned, pre-made, or processed foods. These foods tend to be high in fat, sodium, and added sugar. Shop around the outside edge of the grocery store. This is where you will most often find fresh fruits and vegetables, bulk grains, fresh meats, and fresh dairy products. Cooking Use low-heat cooking methods, such as baking, instead of high-heat cooking methods, such as deep frying. Cook using healthy oils, such as olive, canola, or sunflower oil. Avoid cooking with butter, cream, or high-fat meats. Meal planning Eat meals and snacks regularly, preferably at the same times every day. Avoid going long periods of time without eating. Eat foods that are high in fiber, such as fresh fruits, vegetables, beans, and whole grains. Eat 4-6 oz (112-168 g) of lean protein each day, such as lean meat, chicken, fish, eggs, or tofu. One ounce (oz) (28 g) of lean protein is equal to: 1 oz (28 g) of meat, chicken, or fish. 1 egg.  cup (62 g) of tofu. Eat some foods each day that contain healthy fats, such as avocado, nuts, seeds, and fish. What foods should I eat? Fruits Berries. Apples. Oranges. Peaches. Apricots. Plums. Grapes. Mangoes. Papayas. Pomegranates. Kiwi. Cherries. Vegetables Leafy greens, including lettuce, spinach, kale, chard, collard greens, mustard greens, and cabbage. Beets. Cauliflower. Broccoli. Carrots. Green beans. Tomatoes. Peppers. Onions. Cucumbers. Brussels sprouts. Grains Whole grains, such as whole-wheat or whole-grain bread, crackers, tortillas, cereal, and pasta. Unsweetened oatmeal. Quinoa. Brown or wild rice. Meats and other proteins Seafood. Poultry without skin. Lean cuts of poultry and beef. Tofu. Nuts. Seeds. Dairy Low-fat or fat-free dairy products such as milk, yogurt, and cheese. The items listed above may not be a complete list of foods and beverages you can eat and drink. Contact a  dietitian for more information. What foods should I avoid? Fruits Fruits canned with syrup. Vegetables Canned vegetables. Frozen vegetables with butter or cream sauce. Grains Refined white flour and flour products such as bread, pasta, snack foods, and cereals. Avoid all processed foods. Meats and other proteins Fatty cuts of meat. Poultry with skin. Breaded or fried meats. Processed meat. Avoid saturated fats. Dairy Full-fat yogurt, cheese, or milk. Beverages Sweetened drinks, such as soda or iced tea. The items listed above may not be a complete list of foods and beverages you should avoid. Contact a dietitian for more information. Questions to ask a health care provider Do I need to meet with a certified diabetes care and education specialist? Do I need to meet with a dietitian? What number can I call if I have questions? When are the best times to check my blood glucose? Where to find more information: American Diabetes Association: diabetes.org Academy of Nutrition and Dietetics: eatright.[employer] of Diabetes and Digestive and Kidney Diseases: StageSync.si Association of Diabetes Care & Education Specialists: diabeteseducator.org Summary It is important to have healthy eating habits  because your blood sugar (glucose) levels are greatly affected by what you eat and drink. It is important to use alcohol carefully. A healthy meal plan will help you manage your blood glucose and lower your risk of heart disease. Your health care provider may recommend that you work with a dietitian to make a meal plan that is best for you. This information is not intended to replace advice given to you by your health care provider. Make sure you discuss any questions you have with your health care provider. Document Revised: 09/11/2019 Document Reviewed: 09/11/2019 Elsevier Patient Education  [place].

## 2022-03-19 LAB — MICROALBUMIN / CREATININE URINE RATIO
Creatinine, Urine: 70 mg/dL (ref 20–275)
Microalb Creat Ratio: 53 mcg/mg creat — ABNORMAL HIGH (ref <30)
Microalb, Ur: 3.7 mg/dL

## 2022-03-19 LAB — COMPLETE METABOLIC PANEL WITH GFR
AG Ratio: 1 (calc) (ref 1.0–2.5)
ALT: 10 U/L (ref 6–29)
AST: 10 U/L (ref 10–30)
Albumin: 3.7 g/dL (ref 3.6–5.1)
Alkaline phosphatase (APISO): 89 U/L (ref 31–125)
BUN: 11 mg/dL (ref 7–25)
CO2: 27 mmol/L (ref 20–32)
Calcium: 9.8 mg/dL (ref 8.6–10.2)
Chloride: 102 mmol/L (ref 98–110)
Creat: 0.71 mg/dL (ref 0.50–0.97)
Globulin: 3.8 g/dL (calc) — ABNORMAL HIGH (ref 1.9–3.7)
Glucose, Bld: 369 mg/dL — ABNORMAL HIGH (ref 65–99)
Potassium: 4.7 mmol/L (ref 3.5–5.3)
Sodium: 139 mmol/L (ref 135–146)
Total Bilirubin: 0.3 mg/dL (ref 0.2–1.2)
Total Protein: 7.5 g/dL (ref 6.1–8.1)
eGFR: 114 mL/min/{1.73_m2} (ref >=60)

## 2022-03-19 LAB — CBC WITH DIFFERENTIAL/PLATELET
Absolute Monocytes: 472 cells/uL (ref 200–950)
Basophils Absolute: 53 cells/uL (ref 0–200)
Basophils Relative: 0.9 %
Eosinophils Absolute: 53 cells/uL (ref 15–500)
Eosinophils Relative: 0.9 %
HCT: 36.7 % (ref 35.0–45.0)
Hemoglobin: 11.8 g/dL (ref 11.7–15.5)
Lymphs Abs: 2307 cells/uL (ref 850–3900)
MCH: 27.9 pg (ref 27.0–33.0)
MCHC: 32.2 g/dL (ref 32.0–36.0)
MCV: 86.8 fL (ref 80.0–100.0)
MPV: 11.5 fL (ref 7.5–12.5)
Monocytes Relative: 8 %
Neutro Abs: 3015 cells/uL (ref 1500–7800)
Neutrophils Relative %: 51.1 %
Platelets: 295 10*3/uL (ref 140–400)
RBC: 4.23 10*6/uL (ref 3.80–5.10)
RDW: 12.3 % (ref 11.0–15.0)
Total Lymphocyte: 39.1 %
WBC: 5.9 10*3/uL (ref 3.8–10.8)

## 2022-03-19 LAB — LIPID PANEL
Cholesterol: 137 mg/dL (ref <200)
HDL: 42 mg/dL — ABNORMAL LOW (ref >=50)
LDL Cholesterol (Calc): 73 mg/dL (calc)
Non-HDL Cholesterol (Calc): 95 mg/dL (calc) (ref <130)
Total CHOL/HDL Ratio: 3.3 (calc) (ref <5.0)
Triglycerides: 139 mg/dL (ref <150)

## 2022-03-19 LAB — HEPATITIS C ANTIBODY: Hepatitis C Ab: NONREACTIVE

## 2022-03-19 LAB — HEMOGLOBIN A1C
Hgb A1c MFr Bld: 11.8 % of total Hgb — ABNORMAL HIGH (ref <5.7)
Mean Plasma Glucose: 292 mg/dL
eAG (mmol/L): 16.2 mmol/L

## 2022-04-01 ENCOUNTER — Other Ambulatory Visit: Payer: Self-pay

## 2022-04-14 NOTE — Progress Notes (Deleted)
There were no vitals taken for this visit.   Subjective:    Patient ID: [name] Wolf, female    DOB: 1985/12/21, 37 y.o.   MRN: [id number]  HPI: [name] Wolf is a 37 y.o. female  No chief complaint on file.  Uncontrolled diabetes:she was seen by Dr. Rosana Berger on 03/18/2022. Her A1C was 11.8.  she reported that her blood sugars were running around 300-400.  She was started on metformin extended release 500 mg daily 2 times a day and she was prescribed Rybelsus 7 mg daily.  Patient reports her blood sugars have been running ***.     Relevant past medical, surgical, family and social history reviewed and updated as indicated. Interim medical history since our last visit reviewed. Allergies and medications reviewed and updated.  Review of Systems  Constitutional: Negative for fever or weight change.  Respiratory: Negative for cough and shortness of breath.   Cardiovascular: Negative for chest pain or palpitations.  Gastrointestinal: Negative for abdominal pain, no bowel changes.  Musculoskeletal: Negative for gait problem or joint swelling.  Skin: Negative for rash.  Neurological: Negative for dizziness or headache.  No other specific complaints in a complete review of systems (except as listed in HPI above).      Objective:    There were no vitals taken for this visit.  Wt Readings from Last 3 Encounters:  03/18/22 204 lb 6.4 oz (92.7 kg)  09/01/21 211 lb 14.4 oz (96.1 kg)  07/07/21 213 lb (96.6 kg)    Physical Exam  Constitutional: Patient appears well-developed and well-nourished. Obese *** No distress.  HEENT: head atraumatic, normocephalic, pupils equal and reactive to light, ears ***, neck supple, throat within normal limits Cardiovascular: Normal rate, regular rhythm and normal heart sounds.  No murmur heard. No BLE edema. Pulmonary/Chest: Effort normal and breath sounds normal. No respiratory distress. Abdominal: Soft.  There is no tenderness. Psychiatric:  Patient has a normal mood and affect. behavior is normal. Judgment and thought content normal.   Results for orders placed or performed in visit on 03/18/22  CBC w/Diff/Platelet  Result Value Ref Range   WBC 5.9 3.8 - 10.8 Thousand/uL   RBC 4.23 3.80 - 5.10 Million/uL   Hemoglobin 11.8 11.7 - 15.5 g/dL   HCT 36.7 35.0 - 45.0 %   MCV 86.8 80.0 - 100.0 fL   MCH 27.9 27.0 - 33.0 pg   MCHC 32.2 32.0 - 36.0 g/dL   RDW 12.3 11.0 - 15.0 %   Platelets 295 140 - 400 Thousand/uL   MPV 11.5 7.5 - 12.5 fL   Neutro Abs 3,015 1,500 - 7,800 cells/uL   Lymphs Abs 2,307 850 - 3,900 cells/uL   Absolute Monocytes 472 200 - 950 cells/uL   Eosinophils Absolute 53 15 - 500 cells/uL   Basophils Absolute 53 0 - 200 cells/uL   Neutrophils Relative % 51.1 %   Total Lymphocyte 39.1 %   Monocytes Relative 8.0 %   Eosinophils Relative 0.9 %   Basophils Relative 0.9 %  COMPLETE METABOLIC PANEL WITH GFR  Result Value Ref Range   Glucose, Bld 369 (H) 65 - 99 mg/dL   BUN 11 7 - 25 mg/dL   Creat 0.71 0.50 - 0.97 mg/dL   eGFR 114 > OR = 60 mL/min/1.90m   BUN/Creatinine Ratio SEE NOTE: 6 - 22 (calc)   Sodium 139 135 - 146 mmol/L   Potassium 4.7 3.5 - 5.3 mmol/L   Chloride 102 98 - 110 mmol/L  CO2 27 20 - 32 mmol/L   Calcium 9.8 8.6 - 10.2 mg/dL   Total Protein 7.5 6.1 - 8.1 g/dL   Albumin 3.7 3.6 - 5.1 g/dL   Globulin 3.8 (H) 1.9 - 3.7 g/dL (calc)   AG Ratio 1.0 1.0 - 2.5 (calc)   Total Bilirubin 0.3 0.2 - 1.2 mg/dL   Alkaline phosphatase (APISO) 89 31 - 125 U/L   AST 10 10 - 30 U/L   ALT 10 6 - 29 U/L  HgB A1c  Result Value Ref Range   Hgb A1c MFr Bld 11.8 (H) <5.7 % of total Hgb   Mean Plasma Glucose 292 mg/dL   eAG (mmol/L) 16.2 mmol/L  Lipid Profile  Result Value Ref Range   Cholesterol 137 <200 mg/dL   HDL 42 (L) > OR = 50 mg/dL   Triglycerides 139 <150 mg/dL   LDL Cholesterol (Calc) 73 mg/dL (calc)   Total CHOL/HDL Ratio 3.3 <5.0 (calc)   Non-HDL Cholesterol (Calc) 95 <130 mg/dL (calc)   Urine Microalbumin w/creat. ratio  Result Value Ref Range   Creatinine, Urine 70 20 - 275 mg/dL   Microalb, Ur 3.7 mg/dL   Microalb Creat Ratio 53 (H) <30 mcg/mg creat  Hepatitis C Antibody  Result Value Ref Range   Hepatitis C Ab NON-REACTIVE NON-REACTIVE      Assessment & Plan:   Problem List Items Addressed This Visit   None    Follow up plan: No follow-ups on file.

## 2022-04-14 NOTE — Telephone Encounter (Signed)
PA sent to [facility]

## 2022-04-15 ENCOUNTER — Ambulatory Visit: Payer: Medicaid Other | Admitting: Nurse Practitioner

## 2022-04-15 DIAGNOSIS — E1165 Type 2 diabetes mellitus with hyperglycemia: Secondary | ICD-10-CM

## 2022-04-24 ENCOUNTER — Other Ambulatory Visit: Payer: Self-pay | Admitting: Internal Medicine

## 2022-04-24 DIAGNOSIS — E1165 Type 2 diabetes mellitus with hyperglycemia: Secondary | ICD-10-CM

## 2022-04-25 NOTE — Telephone Encounter (Signed)
Requested Prescriptions  Pending Prescriptions Disp Refills   ACCU-CHEK GUIDE test strip [Pharmacy Med Name: ACCU-CHEK GUIDE TEST STRIP] 100 strip 0    Sig: 1 EACH BY IN VITRO ROUTE IN THE MORNING, AT NOON, AND AT BEDTIME. MAY SUBSTITUTE TO ANY MANUFACTURER COVERED BY PATIENT'S INSURANCE.     Endocrinology: Diabetes - Testing Supplies Passed - 04/24/2022  1:07 AM      Passed - Valid encounter within last 12 months    Recent Outpatient Visits           1 month ago Uncontrolled diabetes mellitus with hyperglycemia, without long-term current use of insulin [facility])   [facility] Teodora Medici, DO   7 months ago Uncontrolled diabetes mellitus with hyperglycemia, without long-term current use of insulin [facility])   [facility] Bo Merino, FNP   11 months ago Type 2 diabetes mellitus treated without insulin [facility]   [facility] Bo Merino, [place]

## 2022-06-20 ENCOUNTER — Other Ambulatory Visit: Payer: Self-pay | Admitting: Internal Medicine

## 2022-06-20 DIAGNOSIS — E1165 Type 2 diabetes mellitus with hyperglycemia: Secondary | ICD-10-CM

## 2022-06-21 NOTE — Telephone Encounter (Signed)
Requested Prescriptions  Pending Prescriptions Disp Refills   metFORMIN (GLUCOPHAGE-XR) 500 MG 24 hr tablet [Pharmacy Med Name: METFORMIN HCL ER 500 MG TABLET] 180 tablet 0    Sig: TAKE 1 TABLET BY MOUTH 2 TIMES DAILY WITH A MEAL.     Endocrinology:  Diabetes - Biguanides Failed - 06/20/2022  1:33 AM      Failed - HBA1C is between 0 and 7.9 and within 180 days    Hgb A1c MFr Bld  Date Value Ref Range Status  03/18/2022 11.8 (H) <5.7 % of total Hgb Final    Comment:    For someone without known diabetes, a hemoglobin A1c value of 6.5% or greater indicates that they may have  diabetes and this should be confirmed with a follow-up  test. . For someone with known diabetes, a value <7% indicates  that their diabetes is well controlled and a value  greater than or equal to 7% indicates suboptimal  control. A1c targets should be individualized based on  duration of diabetes, age, comorbid conditions, and  other considerations. . Currently, no consensus exists regarding use of hemoglobin A1c for diagnosis of diabetes for children. .          Failed - B12 Level in normal range and within 720 days    No results found for: "VITAMINB12"       Passed - Cr in normal range and within 360 days    Creat  Date Value Ref Range Status  03/18/2022 0.71 0.50 - 0.97 mg/dL Final   Creatinine, Urine  Date Value Ref Range Status  03/18/2022 70 20 - 275 mg/dL Final         Passed - eGFR in normal range and within 360 days    GFR calc Af Amer  Date Value Ref Range Status  06/02/2019 >60 >60 mL/min Final   GFR, Estimated  Date Value Ref Range Status  04/11/2020 >60 >60 mL/min Final    Comment:    (NOTE) Calculated using the CKD-EPI Creatinine Equation (2021)    eGFR  Date Value Ref Range Status  03/18/2022 114 > OR = 60 mL/min/1.29m2 Final         Passed - Valid encounter within last 6 months    Recent Outpatient Visits           3 months ago Uncontrolled diabetes mellitus with  hyperglycemia, without long-term current use of insulin [facility])   [place] [facility] Margarita Mail, DO   9 months ago Uncontrolled diabetes mellitus with hyperglycemia, without long-term current use of insulin [facility])   [place] [facility] Della Goo F, FNP   1 year ago Type 2 diabetes mellitus treated without insulin [facility])   [place] [facility] [place], Raynelle Fanning F, FNP              Passed - CBC within normal limits and completed in the last 12 months    WBC  Date Value Ref Range Status  03/18/2022 5.9 3.8 - 10.8 Thousand/uL Final   RBC  Date Value Ref Range Status  03/18/2022 4.23 3.80 - 5.10 Million/uL Final   Hemoglobin  Date Value Ref Range Status  03/18/2022 11.8 11.7 - 15.5 g/dL Final  16/11/9602 54.0 11.1 - 15.9 g/dL Final   HCT  Date Value Ref Range Status  03/18/2022 36.7 35.0 - 45.0 % Final   Hematocrit  Date Value Ref Range Status  03/06/2015 36.1 34.0 - 46.6 % Final  MCHC  Date Value Ref Range Status  03/18/2022 32.2 32.0 - 36.0 g/dL Final   [facility]  Date Value Ref Range Status  03/18/2022 27.9 27.0 - 33.0 pg Final   MCV  Date Value Ref Range Status  03/18/2022 86.8 80.0 - 100.0 fL Final  03/06/2015 85 79 - 97 fL Final   No results found for: "PLTCOUNTKUC", "LABPLAT", "POCPLA" RDW  Date Value Ref Range Status  03/18/2022 12.3 11.0 - 15.0 % Final  03/06/2015 13.1 12.3 - 15.4 % Final

## 2022-08-16 ENCOUNTER — Encounter: Payer: Self-pay | Admitting: Oncology

## 2022-08-16 ENCOUNTER — Emergency Department
Admission: EM | Admit: 2022-08-16 | Discharge: 2022-08-16 | Disposition: A | Discharge status: Home / Self Care | Payer: BLUE CROSS/BLUE SHIELD | Attending: Emergency Medicine | Admitting: Emergency Medicine

## 2022-08-16 ENCOUNTER — Encounter: Payer: Self-pay | Admitting: Emergency Medicine

## 2022-08-16 ENCOUNTER — Other Ambulatory Visit: Payer: Self-pay

## 2022-08-16 DIAGNOSIS — E1165 Type 2 diabetes mellitus with hyperglycemia: Secondary | ICD-10-CM | POA: Insufficient documentation

## 2022-08-16 DIAGNOSIS — N390 Urinary tract infection, site not specified: Secondary | ICD-10-CM | POA: Diagnosis not present

## 2022-08-16 DIAGNOSIS — R1030 Lower abdominal pain, unspecified: Secondary | ICD-10-CM | POA: Diagnosis present

## 2022-08-16 DIAGNOSIS — R739 Hyperglycemia, unspecified: Secondary | ICD-10-CM

## 2022-08-16 LAB — COMPREHENSIVE METABOLIC PANEL
ALT: 13 U/L (ref 0–44)
AST: 21 U/L (ref 15–41)
Albumin: 3.5 g/dL (ref 3.5–5.0)
Alkaline Phosphatase: 98 U/L (ref 38–126)
Anion gap: 12 (ref 5–15)
BUN: 16 mg/dL (ref 6–20)
CO2: 22 mmol/L (ref 22–32)
Calcium: 9.3 mg/dL (ref 8.9–10.3)
Chloride: 94 mmol/L — ABNORMAL LOW (ref 98–111)
Creatinine, Ser: 0.87 mg/dL (ref 0.44–1.00)
GFR, Estimated: >60 mL/min (ref >60)
Glucose, Bld: 490 mg/dL — ABNORMAL HIGH (ref 70–99)
Potassium: 3.6 mmol/L (ref 3.5–5.1)
Sodium: 128 mmol/L — ABNORMAL LOW (ref 135–145)
Total Bilirubin: 0.5 mg/dL (ref 0.3–1.2)
Total Protein: 8.1 g/dL (ref 6.5–8.1)

## 2022-08-16 LAB — URINALYSIS, ROUTINE W REFLEX MICROSCOPIC
Bilirubin Urine: NEGATIVE
Glucose, UA: >=500 mg/dL — AB
Ketones, ur: 5 mg/dL — AB
Nitrite: NEGATIVE
Protein, ur: 30 mg/dL — AB
Specific Gravity, Urine: 1.033 — ABNORMAL HIGH (ref 1.005–1.030)
WBC, UA: >50 WBC/hpf (ref 0–5)
pH: 5 (ref 5.0–8.0)

## 2022-08-16 LAB — CBC
HCT: 40.1 % (ref 36.0–46.0)
Hemoglobin: 12.2 g/dL (ref 12.0–15.0)
MCH: 24.4 pg — ABNORMAL LOW (ref 26.0–34.0)
MCHC: 30.4 g/dL (ref 30.0–36.0)
MCV: 80 fL (ref 80.0–100.0)
Platelets: 365 10*3/uL (ref 150–400)
RBC: 5.01 MIL/uL (ref 3.87–5.11)
RDW: 15 % (ref 11.5–15.5)
WBC: 6.2 10*3/uL (ref 4.0–10.5)
nRBC: 0 % (ref 0.0–0.2)

## 2022-08-16 LAB — CBG MONITORING, ED: Glucose-Capillary: 461 mg/dL — ABNORMAL HIGH (ref 70–99)

## 2022-08-16 LAB — PREGNANCY, URINE: Preg Test, Ur: NEGATIVE

## 2022-08-16 LAB — LIPASE, BLOOD: Lipase: 39 U/L (ref 11–51)

## 2022-08-16 MED ORDER — CEPHALEXIN 500 MG PO CAPS
500.0000 mg | ORAL_CAPSULE | Freq: Once | ORAL | Status: AC
  Administered 2022-08-16: 500 mg via ORAL
  Filled 2022-08-16: qty 1

## 2022-08-16 MED ORDER — METFORMIN HCL ER 500 MG PO TB24
1000.0000 mg | ORAL_TABLET | Freq: Every day | ORAL | 2 refills | Status: DC
Start: 2022-08-16 — End: 2022-08-22

## 2022-08-16 MED ORDER — CEPHALEXIN 500 MG PO CAPS: 500.0000 mg | ORAL_CAPSULE | Freq: Three times a day (TID) | ORAL | 0 refills | Status: DC

## 2022-08-16 MED ORDER — PHENAZOPYRIDINE HCL 200 MG PO TABS
200.0000 mg | ORAL_TABLET | Freq: Once | ORAL | Status: AC
  Administered 2022-08-16: 200 mg via ORAL
  Filled 2022-08-16: qty 1

## 2022-08-16 NOTE — Discharge Instructions (Addendum)
As we discussed is very important they restart your metformin to lower your blood sugar.  Is also important you take antibiotic for its entire course as prescribed.  Please follow-up with your doctor within the next several days to discuss your diabetic regimen and blood sugar.  Return to the emergency department for any symptoms personally concerning to yourself.

## 2022-08-16 NOTE — ED Provider Notes (Signed)
[facility] Provider Note    Event Date/Time   First MD Initiated Contact with Patient 08/16/22 2125     (approximate)  History   Chief Complaint: Abdominal Pain  HPI  [name] Wolf is a 37 y.o. female with a past medical history of diabetes presents to the emergency department for lower abdominal pain, urinary urgency and foul-smelling urine.  According to the patient for the past week or more she has been experiencing urinary urgency as well as a foul smell to the urine.  Patient denies any pain when she urinates however feels like she needs to urinate often only little comes out at a time.  No fever, no back pain.  Physical Exam   Triage Vital Signs: ED Triage Vitals [08/16/22 1825]  Enc Vitals Group     BP (!) 154/108     Pulse Rate (!) 122     Resp 18     Temp 97.8 F (36.6 C)     Temp Source Oral     SpO2 97 %     Weight      Height      Head Circumference      Peak Flow      Pain Score 6     Pain Loc      Pain Edu?      Excl. in GC?     Most recent vital signs: Vitals:   08/16/22 1825  BP: (!) 154/108  Pulse: (!) 122  Resp: 18  Temp: 97.8 F (36.6 C)  SpO2: 97%    General: Awake, no distress.  CV:  Good peripheral perfusion.  Regular rate and rhythm  Resp:  Normal effort.  Equal breath sounds bilaterally.  Abd:  No distention.  Soft, mild suprapubic tenderness to palpation without rebound or guarding.  ED Results / Procedures / Treatments   MEDICATIONS ORDERED IN ED: Medications  cephALEXin (KEFLEX) capsule 500 mg (has no administration in time range)  phenazopyridine (PYRIDIUM) tablet 200 mg (has no administration in time range)     IMPRESSION / MDM / ASSESSMENT AND PLAN / ED COURSE  I reviewed the triage vital signs and the nursing notes.  Patient's presentation is most consistent with acute presentation with potential threat to life or bodily function.  Patient presents emergency department for lower abdominal  discomfort urinary urgency and foul-smelling urine.  Patient's lab work has resulted showing a urinary tract infection with greater than 50 white cells with bacteria and white blood cell clumps.  However also it shows significant hyperglycemia with a blood glucose of 490.  Patient states she is prescribed metformin but has stopped taking it recently due to abdominal discomfort that it causes her.  I discussed with the patient the importance of starting back on the metformin until she can follow-up with her doctor to discuss further medications to treat her diabetes.  The remainder of the labs including CBC are reassuring and pregnancy test is negative.  Given the patient's significant hyperglycemia in the emergency department I recommended IV placement for fluids and insulin to help bring down the blood sugar.  Discussed with the patient the importance of lowering blood sugar to deal with urinary tract infections as well.  Patient states she has 2 kids at home and she needs to leave the emergency department she is already been here almost 4 hours.  Patient understands importance of restarting metformin states she will do it first thing in the morning we will dose the  patient's first antibiotic in the emergency department and discharged with 10 days of Keflex as well as refills of metformin.  Patient agreeable to plan of care.  FINAL CLINICAL IMPRESSION(S) / ED DIAGNOSES   Hyperglycemia Urinary tract infection   Note:  This document was prepared using Dragon voice recognition software and may include unintentional dictation errors.   [name], MD 08/16/22 2151

## 2022-08-16 NOTE — ED Triage Notes (Signed)
Patient to ED with lower abd pain x2 days. States urgency with urination. Denies burning but states it has a sweet smell. Hx of diabetes.

## 2022-08-17 ENCOUNTER — Encounter: Payer: Self-pay | Admitting: Oncology

## 2022-08-19 NOTE — Progress Notes (Unsigned)
There were no vitals taken for this visit.   Subjective:    Patient ID: [name] Wolf, female    DOB: 01/10/1986, 37 y.o.   MRN: [id number]  HPI: [name] Wolf is a 37 y.o. female  No chief complaint on file.  Diabetes, Type 2: - patient has not been consistent with follow up appointment or taking medications, last seen by this provider back in 08/2021, saw Dr. Caralee Ates in January 2024.  Discussed with patient that she has uncontrolled DM and should be seen every 3-4 months.   -Last A1c 11.8 -Medications: metformin 1000 mg BID -Patient is not compliant with medications  -Checking BG at home: *** -Diet: decrease sugar and processed foods in your diet -Exercise: recommend increasing physical activity as tolerated -Eye exam: needs to schedule -Foot exam: utd -Microalbumin: utd -Statin: no -Denies symptoms of hypoglycemia, polyuria, polydipsia, numbness extremities, foot ulcers/trauma.   Recently seen in er blood sugar was 490.  Relevant past medical, surgical, family and social history reviewed and updated as indicated. Interim medical history since our last visit reviewed. Allergies and medications reviewed and updated.  Review of Systems  Constitutional: Negative for fever or weight change.  Respiratory: Negative for cough and shortness of breath.   Cardiovascular: Negative for chest pain or palpitations.  Gastrointestinal: Negative for abdominal pain, no bowel changes.  Musculoskeletal: Negative for gait problem or joint swelling.  Skin: Negative for rash.  Neurological: Negative for dizziness or headache.  No other specific complaints in a complete review of systems (except as listed in HPI above).      Objective:    There were no vitals taken for this visit.  Wt Readings from Last 3 Encounters:  03/18/22 204 lb 6.4 oz (92.7 kg)  09/01/21 211 lb 14.4 oz (96.1 kg)  07/07/21 213 lb (96.6 kg)    Physical Exam  Constitutional: Patient appears well-developed and  well-nourished. Obese *** No distress.  HEENT: head atraumatic, normocephalic, pupils equal and reactive to light, ears ***, neck supple, throat within normal limits Cardiovascular: Normal rate, regular rhythm and normal heart sounds.  No murmur heard. No BLE edema. Pulmonary/Chest: Effort normal and breath sounds normal. No respiratory distress. Abdominal: Soft.  There is no tenderness. Psychiatric: Patient has a normal mood and affect. behavior is normal. Judgment and thought content normal.  Results for orders placed or performed during the hospital encounter of 08/16/22  Lipase, blood  Result Value Ref Range   Lipase 39 11 - 51 U/L  Comprehensive metabolic panel  Result Value Ref Range   Sodium 128 (L) 135 - 145 mmol/L   Potassium 3.6 3.5 - 5.1 mmol/L   Chloride 94 (L) 98 - 111 mmol/L   CO2 22 22 - 32 mmol/L   Glucose, Bld 490 (H) 70 - 99 mg/dL   BUN 16 6 - 20 mg/dL   Creatinine, Ser 4.09 0.44 - 1.00 mg/dL   Calcium 9.3 8.9 - 81.1 mg/dL   Total Protein 8.1 6.5 - 8.1 g/dL   Albumin 3.5 3.5 - 5.0 g/dL   AST 21 15 - 41 U/L   ALT 13 0 - 44 U/L   Alkaline Phosphatase 98 38 - 126 U/L   Total Bilirubin 0.5 0.3 - 1.2 mg/dL   GFR, Estimated >91 >47 mL/min   Anion gap 12 5 - 15  CBC  Result Value Ref Range   WBC 6.2 4.0 - 10.5 K/uL   RBC 5.01 3.87 - 5.11 MIL/uL   Hemoglobin 12.2 12.0 -  15.0 g/dL   HCT 16.1 09.6 - 04.5 %   MCV 80.0 80.0 - 100.0 fL   MCH 24.4 (L) 26.0 - 34.0 pg   MCHC 30.4 30.0 - 36.0 g/dL   RDW 40.9 81.1 - 91.4 %   Platelets 365 150 - 400 K/uL   nRBC 0.0 0.0 - 0.2 %  Urinalysis, Routine w reflex microscopic -Urine, Clean Catch  Result Value Ref Range   Color, Urine YELLOW (A) YELLOW   APPearance CLOUDY (A) CLEAR   Specific Gravity, Urine 1.033 (H) 1.005 - 1.030   pH 5.0 5.0 - 8.0   Glucose, UA >=500 (A) NEGATIVE mg/dL   Hgb urine dipstick MODERATE (A) NEGATIVE   Bilirubin Urine NEGATIVE NEGATIVE   Ketones, ur 5 (A) NEGATIVE mg/dL   Protein, ur 30 (A)  NEGATIVE mg/dL   Nitrite NEGATIVE NEGATIVE   Leukocytes,Ua LARGE (A) NEGATIVE   RBC / HPF 11-20 0 - 5 RBC/hpf   WBC, UA >50 0 - 5 WBC/hpf   Bacteria, UA RARE (A) NONE SEEN   Squamous Epithelial / HPF 11-20 0 - 5 /HPF   WBC Clumps PRESENT    Mucus PRESENT   Pregnancy, urine  Result Value Ref Range   Preg Test, Ur NEGATIVE NEGATIVE  CBG monitoring, ED  Result Value Ref Range   Glucose-Capillary 461 (H) 70 - 99 mg/dL   Comment 1 Notify RN    Comment 2 Document in Chart       Assessment & Plan:   Problem List Items Addressed This Visit       Endocrine   Uncontrolled diabetes mellitus with hyperglycemia, without long-term current use of insulin (HCC) - Primary     Follow up plan: No follow-ups on file.

## 2022-08-22 ENCOUNTER — Encounter: Payer: Self-pay | Admitting: Oncology

## 2022-08-22 ENCOUNTER — Ambulatory Visit (INDEPENDENT_AMBULATORY_CARE_PROVIDER_SITE_OTHER): Payer: BLUE CROSS/BLUE SHIELD | Admitting: Nurse Practitioner

## 2022-08-22 ENCOUNTER — Encounter: Payer: Self-pay | Admitting: Nurse Practitioner

## 2022-08-22 ENCOUNTER — Other Ambulatory Visit: Payer: Self-pay

## 2022-08-22 VITALS — BP 128/76 | HR 98 | Temp 98.0°F | Resp 18 | Ht 63.0 in | Wt 184.0 lb

## 2022-08-22 DIAGNOSIS — N3000 Acute cystitis without hematuria: Secondary | ICD-10-CM

## 2022-08-22 DIAGNOSIS — Z7984 Long term (current) use of oral hypoglycemic drugs: Secondary | ICD-10-CM

## 2022-08-22 DIAGNOSIS — E1165 Type 2 diabetes mellitus with hyperglycemia: Secondary | ICD-10-CM

## 2022-08-22 DIAGNOSIS — R197 Diarrhea, unspecified: Secondary | ICD-10-CM | POA: Diagnosis not present

## 2022-08-22 LAB — POCT GLYCOSYLATED HEMOGLOBIN (HGB A1C): Hemoglobin A1C: 12.3 % — AB (ref 4.0–5.6)

## 2022-08-22 MED ORDER — TIRZEPATIDE 2.5 MG/0.5ML ~~LOC~~ SOAJ
2.5000 mg | SUBCUTANEOUS | 0 refills | Status: DC
Start: 2022-08-22 — End: 2022-09-29

## 2022-08-22 MED ORDER — SULFAMETHOXAZOLE-TRIMETHOPRIM 800-160 MG PO TABS
1.0000 | ORAL_TABLET | Freq: Two times a day (BID) | ORAL | 0 refills | Status: AC
Start: 2022-08-22 — End: 2022-08-29

## 2022-08-22 NOTE — Assessment & Plan Note (Signed)
Discussed with patient that she has to take her diabetes more serious.  She is not currently taking any medication.  Will try and get her approved for mounjaro. She does not tolerate metformin.

## 2022-08-24 ENCOUNTER — Telehealth: Payer: Self-pay

## 2022-08-24 LAB — URINE CULTURE
MICRO NUMBER:: 15151732
Result:: NO GROWTH
SPECIMEN QUALITY:: ADEQUATE

## 2022-08-24 NOTE — Progress Notes (Signed)
   Care Guide Note  08/24/2022 Name: [name] Wolf MRN: [id number] DOB: 1985/03/16  Referred by: Berniece Salines, FNP Reason for referral : Care Coordination (Outreach to schedule with Pharm d New MM DM )   [name] Wolf is a 37 y.o. year old female who is a primary care patient of Berniece Salines, FNP. [name] Wolf was referred to the pharmacist for assistance related to DM.    An unsuccessful telephone outreach was attempted today to contact the patient who was referred to the pharmacy team for assistance with medication assistance. Additional attempts will be made to contact the patient.   Penne Lash, RMA Care Guide [facility]  [place], [place] [address] Direct Dial: [phone] [email or web address]

## 2022-09-20 NOTE — Progress Notes (Signed)
   Care Guide Note  09/20/2022 Name: [name] Wolf MRN: [id number] DOB: 09/27/1985  Referred by: Berniece Salines, FNP Reason for referral : Care Coordination (Outreach to schedule with Pharm d New MM DM )   [name] Wolf is a 37 y.o. year old female who is a primary care patient of Berniece Salines, FNP. [name] Wolf was referred to the pharmacist for assistance related to DM.    A second unsuccessful telephone outreach was attempted today to contact the patient who was referred to the pharmacy team for assistance with medication management. Additional attempts will be made to contact the patient.  Penne Lash, RMA Care Guide [facility]  [place], [place] [address] Direct Dial: [phone] [email or web address]

## 2022-09-23 ENCOUNTER — Other Ambulatory Visit: Payer: Self-pay | Admitting: Nurse Practitioner

## 2022-09-23 DIAGNOSIS — E1165 Type 2 diabetes mellitus with hyperglycemia: Secondary | ICD-10-CM

## 2022-09-23 NOTE — Telephone Encounter (Signed)
Requested medication (s) are due for refill today - no  Requested medication (s) are on the active medication list -yes  Future visit scheduled -yes  Last refill: 08/22/22 2ml  Notes to clinic: Pharmacy request: PA required- alternative requested  Requested Prescriptions  Pending Prescriptions Disp Refills   MOUNJARO 2.5 MG/0.5ML Pen [Pharmacy Med Name: MOUNJARO 2.5 MG/0.5 ML PEN]  0    Sig: INJECT 2.5 MG SUBCUTANEOUSLY WEEKLY     Off-Protocol Failed - 09/23/2022 11:25 AM      Failed - Medication not assigned to a protocol, review manually.      Passed - Valid encounter within last 12 months    Recent Outpatient Visits           1 month ago Uncontrolled diabetes mellitus with hyperglycemia, without long-term current use of insulin [facility])   [place] Della Goo F, FNP   6 months ago Uncontrolled diabetes mellitus with hyperglycemia, without long-term current use of insulin [facility])   [place] [facility] Margarita Mail, DO   1 year ago Uncontrolled diabetes mellitus with hyperglycemia, without long-term current use of insulin [facility])   [place] [facility] Della Goo F, FNP   1 year ago Type 2 diabetes mellitus treated without insulin [facility])   [place] [facility] Berniece Salines, FNP       Future Appointments             In 2 months Zane Herald, Rudolpho Sevin, FNP [facility], [facility]               Requested Prescriptions  Pending Prescriptions Disp Refills   MOUNJARO 2.5 MG/0.5ML Pen [Pharmacy Med Name: MOUNJARO 2.5 MG/0.5 ML PEN]  0    Sig: INJECT 2.5 MG SUBCUTANEOUSLY WEEKLY     Off-Protocol Failed - 09/23/2022 11:25 AM      Failed - Medication not assigned to a protocol, review manually.      Passed - Valid encounter within last 12 months    Recent Outpatient Visits           1 month ago Uncontrolled diabetes mellitus with hyperglycemia, without long-term current  use of insulin [facility])   [place] [facility] Della Goo F, FNP   6 months ago Uncontrolled diabetes mellitus with hyperglycemia, without long-term current use of insulin [facility])   [place] [facility] Margarita Mail, DO   1 year ago Uncontrolled diabetes mellitus with hyperglycemia, without long-term current use of insulin [facility])   [place] [facility] Della Goo F, FNP   1 year ago Type 2 diabetes mellitus treated without insulin [facility])   [facility] Berniece Salines, FNP       Future Appointments             In 2 months Zane Herald, Rudolpho Sevin, FNP [facility], [facility]

## 2022-11-01 NOTE — Progress Notes (Signed)
   Care Guide Note  11/01/2022 Name: [name] Wolf MRN: [id number] DOB: 1986-02-01  Referred by: Berniece Salines, FNP Reason for referral : Care Coordination (Outreach to schedule with Pharm d New MM DM )   [name] Wolf is a 37 y.o. year old female who is a primary care patient of Berniece Salines, FNP. [name] Wolf was referred to the pharmacist for assistance related to DM.    A third unsuccessful telephone outreach was attempted today to contact the patient who was referred to the pharmacy team for assistance with medication management. The Population Health team is pleased to engage with this patient at any time in the future upon receipt of referral and should he/she be interested in assistance from the [facility] team.   Penne Lash, RMA Care Guide [facility]  [place], [place] [address] Direct Dial: [phone] [email or web address]

## 2022-11-21 NOTE — Progress Notes (Deleted)
   There were no vitals taken for this visit.   Subjective:    Patient ID: [name] Wolf, female    DOB: 08/04/1985, 37 y.o.   MRN: [id number]  HPI: [name] Wolf is a 37 y.o. female  No chief complaint on file.  Diabetes, Type 2: patient has not been consistent with follow up appointments or taking medications, saw Dr. Caralee Ates in January 2024, then saw me on 08/22/2022.  Discussed with patient that she has uncontrolled DM and should be seen every 3-4 months. She did not tolerate metformin, tried to get patient started on mounjaro.  -Last A1c 12.3 -Medications: *** -Patient is compliant with the above medications and reports no side effects. *** -Checking BG at home: *** -Fasting home BG: *** -Post-prandial home BG: *** -Highest home BG since last visit: *** -Lowest home BG since last visit: *** -Diet: reduce sugar and processed foods in your diet  -Exercise: recommend 150 min of physical activity weekly   -Eye exam: needs to schedule -Foot exam: due -Microalbumin: utd -Statin: no -PNA vaccine: no -Denies symptoms of hypoglycemia, polyuria, polydipsia, numbness extremities, foot ulcers/trauma. ***    Relevant past medical, surgical, family and social history reviewed and updated as indicated. Interim medical history since our last visit reviewed. Allergies and medications reviewed and updated.  Review of Systems  Constitutional: Negative for fever or weight change.  Respiratory: Negative for cough and shortness of breath.   Cardiovascular: Negative for chest pain or palpitations.  Gastrointestinal: Negative for abdominal pain, diarrhea Musculoskeletal: Negative for gait problem or joint swelling.  Skin: Negative for rash.  Neurological: Negative for dizziness or headache.  No other specific complaints in a complete review of systems (except as listed in HPI above).      Objective:    There were no vitals taken for this visit.  Wt Readings from Last 3 Encounters:   08/22/22 184 lb (83.5 kg)  03/18/22 204 lb 6.4 oz (92.7 kg)  09/01/21 211 lb 14.4 oz (96.1 kg)    Physical Exam  Constitutional: Patient appears well-developed and well-nourished. Obese  No distress.  HEENT: head atraumatic, normocephalic, pupils equal and reactive to light, neck supple, throat within normal limits Cardiovascular: Normal rate, regular rhythm and normal heart sounds.  No murmur heard. No BLE edema. Pulmonary/Chest: Effort normal and breath sounds normal. No respiratory distress. Abdominal: Soft.  There is no tenderness. No CVA tenderness Psychiatric: Patient has a normal mood and affect. behavior is normal. Judgment and thought content normal.  Results for orders placed or performed in visit on 08/22/22  Urine Culture   Specimen: Urine  Result Value Ref Range   MICRO NUMBER: [id number]    SPECIMEN QUALITY: Adequate    Sample Source URINE    STATUS: FINAL    Result: No Growth   POCT HgB A1C  Result Value Ref Range   Hemoglobin A1C 12.3 (A) 4.0 - 5.6 %   HbA1c POC (<> result, manual entry)     HbA1c, POC (prediabetic range)     HbA1c, POC (controlled diabetic range)        Assessment & Plan:   Problem List Items Addressed This Visit   None     Follow up plan: No follow-ups on file.

## 2022-11-22 ENCOUNTER — Ambulatory Visit: Payer: BLUE CROSS/BLUE SHIELD | Admitting: Nurse Practitioner

## 2022-11-22 DIAGNOSIS — E1165 Type 2 diabetes mellitus with hyperglycemia: Secondary | ICD-10-CM

## 2023-05-10 ENCOUNTER — Other Ambulatory Visit: Payer: Self-pay

## 2023-05-10 ENCOUNTER — Emergency Department
Admission: EM | Admit: 2023-05-10 | Discharge: 2023-05-10 | Disposition: A | Discharge status: Home / Self Care | Attending: Emergency Medicine | Admitting: Emergency Medicine

## 2023-05-10 DIAGNOSIS — R739 Hyperglycemia, unspecified: Secondary | ICD-10-CM

## 2023-05-10 DIAGNOSIS — N3000 Acute cystitis without hematuria: Secondary | ICD-10-CM | POA: Insufficient documentation

## 2023-05-10 DIAGNOSIS — R197 Diarrhea, unspecified: Secondary | ICD-10-CM | POA: Diagnosis not present

## 2023-05-10 DIAGNOSIS — R112 Nausea with vomiting, unspecified: Secondary | ICD-10-CM

## 2023-05-10 DIAGNOSIS — R109 Unspecified abdominal pain: Secondary | ICD-10-CM | POA: Diagnosis present

## 2023-05-10 DIAGNOSIS — E1165 Type 2 diabetes mellitus with hyperglycemia: Secondary | ICD-10-CM | POA: Insufficient documentation

## 2023-05-10 LAB — CBC
HCT: 34.6 % — ABNORMAL LOW (ref 36.0–46.0)
Hemoglobin: 10.4 g/dL — ABNORMAL LOW (ref 12.0–15.0)
MCH: 22 pg — ABNORMAL LOW (ref 26.0–34.0)
MCHC: 30.1 g/dL (ref 30.0–36.0)
MCV: 73.3 fL — ABNORMAL LOW (ref 80.0–100.0)
Platelets: 406 10*3/uL — ABNORMAL HIGH (ref 150–400)
RBC: 4.72 MIL/uL (ref 3.87–5.11)
RDW: 18 % — ABNORMAL HIGH (ref 11.5–15.5)
WBC: 5.6 10*3/uL (ref 4.0–10.5)
nRBC: 0 % (ref 0.0–0.2)

## 2023-05-10 LAB — CHLAMYDIA/NGC RT PCR (ARMC ONLY)
Chlamydia Tr: NOT DETECTED
N gonorrhoeae: NOT DETECTED

## 2023-05-10 LAB — URINALYSIS, ROUTINE W REFLEX MICROSCOPIC
Bilirubin Urine: NEGATIVE
Glucose, UA: >=500 mg/dL — AB
Hgb urine dipstick: NEGATIVE
Ketones, ur: NEGATIVE mg/dL
Nitrite: NEGATIVE
Protein, ur: NEGATIVE mg/dL
Specific Gravity, Urine: 1.029 (ref 1.005–1.030)
Squamous Epithelial / HPF: >50 /HPF (ref 0–5)
WBC, UA: >50 WBC/hpf (ref 0–5)
pH: 5 (ref 5.0–8.0)

## 2023-05-10 LAB — COMPREHENSIVE METABOLIC PANEL
ALT: 11 U/L (ref 0–44)
AST: 16 U/L (ref 15–41)
Albumin: 3.3 g/dL — ABNORMAL LOW (ref 3.5–5.0)
Alkaline Phosphatase: 86 U/L (ref 38–126)
Anion gap: 8 (ref 5–15)
BUN: 9 mg/dL (ref 6–20)
CO2: 26 mmol/L (ref 22–32)
Calcium: 9.6 mg/dL (ref 8.9–10.3)
Chloride: 103 mmol/L (ref 98–111)
Creatinine, Ser: 0.66 mg/dL (ref 0.44–1.00)
GFR, Estimated: >60 mL/min (ref >60)
Glucose, Bld: 375 mg/dL — ABNORMAL HIGH (ref 70–99)
Potassium: 4.1 mmol/L (ref 3.5–5.1)
Sodium: 137 mmol/L (ref 135–145)
Total Bilirubin: 0.6 mg/dL (ref 0.0–1.2)
Total Protein: 7.8 g/dL (ref 6.5–8.1)

## 2023-05-10 LAB — POC URINE PREG, ED: Preg Test, Ur: NEGATIVE

## 2023-05-10 LAB — HIV ANTIBODY (ROUTINE TESTING W REFLEX): HIV Screen 4th Generation wRfx: NONREACTIVE

## 2023-05-10 LAB — CBG MONITORING, ED: Glucose-Capillary: 323 mg/dL — ABNORMAL HIGH (ref 70–99)

## 2023-05-10 LAB — LIPASE, BLOOD: Lipase: 58 U/L — ABNORMAL HIGH (ref 11–51)

## 2023-05-10 MED ORDER — ONDANSETRON HCL 4 MG/2ML IJ SOLN
4.0000 mg | Freq: Once | INTRAMUSCULAR | Status: AC
  Administered 2023-05-10: 4 mg via INTRAVENOUS
  Filled 2023-05-10: qty 2

## 2023-05-10 MED ORDER — ONDANSETRON HCL 4 MG PO TABS: 4.0000 mg | ORAL_TABLET | Freq: Four times a day (QID) | ORAL | 0 refills | Status: AC | PRN

## 2023-05-10 MED ORDER — KETOROLAC TROMETHAMINE 15 MG/ML IJ SOLN
15.0000 mg | Freq: Once | INTRAMUSCULAR | Status: AC
  Administered 2023-05-10: 15 mg via INTRAVENOUS
  Filled 2023-05-10: qty 1

## 2023-05-10 MED ORDER — INSULIN GLARGINE 100 UNIT/ML SOLOSTAR PEN: 10.0000 [IU] | PEN_INJECTOR | Freq: Every day | SUBCUTANEOUS | 2 refills | Status: AC

## 2023-05-10 MED ORDER — NITROFURANTOIN MONOHYD MACRO 100 MG PO CAPS: 100.0000 mg | ORAL_CAPSULE | Freq: Two times a day (BID) | ORAL | 0 refills | Status: AC

## 2023-05-10 MED ORDER — LINAGLIPTIN 5 MG PO TABS: 5.0000 mg | ORAL_TABLET | Freq: Every day | ORAL | 2 refills | Status: AC

## 2023-05-10 MED ORDER — BLOOD GLUCOSE MONITORING SUPPL DEVI: 1.0000 | Freq: Three times a day (TID) | 0 refills | Status: AC

## 2023-05-10 MED ORDER — SODIUM CHLORIDE 0.9 % IV BOLUS
1000.0000 mL | Freq: Once | INTRAVENOUS | Status: AC
  Administered 2023-05-10: 1000 mL via INTRAVENOUS

## 2023-05-10 MED ORDER — LANCETS MISC. MISC: 1.0000 | Freq: Three times a day (TID) | 0 refills | Status: AC

## 2023-05-10 MED ORDER — LANCET DEVICE MISC: 1.0000 | Freq: Three times a day (TID) | 0 refills | Status: AC

## 2023-05-10 MED ORDER — BLOOD GLUCOSE TEST VI STRP: 1.0000 | ORAL_STRIP | Freq: Three times a day (TID) | 0 refills | Status: AC

## 2023-05-10 NOTE — Inpatient Diabetes Management (Addendum)
 Inpatient Diabetes Program Recommendations  AACE/ADA: New Consensus Statement on Inpatient Glycemic Control   Target Ranges:  Prepandial:   less than 140 mg/dL      Peak postprandial:   less than 180 mg/dL (1-2 hours)      Critically ill patients:  140 - 180 mg/dL    Latest Reference Range & Units 05/10/23 09:04  Glucose-Capillary 70 - 99 mg/dL 161 (H)    Latest Reference Range & Units 05/10/23 09:02  CO2 22 - 32 mmol/L 26  Glucose 70 - 99 mg/dL 096 (H)  Anion gap 5 - 15  8   Review of Glycemic Control  Diabetes history: DM2 Outpatient Diabetes medications: Metformin 1000 mg BID (pt stopped taking due to GI intolerance) was prescribed Mounjaro 2.5 mg Qweek in July 2024 but insurance denied Current orders for Inpatient glycemic control: NA; in ED  Inpatient Diabetes Program Recommendations:    Insulin: If patient is admitted, please consider ordering Semglee 10 units Q24H, CBGs Q4H, and Novolog 0-9 units Q4H.  HbgA1C: Please consider ordering an A1C to evaluate glycemic control over the past 2-3 months. Last A1C in Care Everywhere 12.3% on 08/22/22.  Outpatient DM: Patient will need DM medication prescribed at discharge. Patient states she did not tolerate Metformin and Glipizide did not work for her.  May want to consider discharging on Tradjenta 5 mg daily and Lantus pens (#[id number]), insulin pen needles (#[id number]), and glucometer test strips (#[id number]).   NOTE: Patient is currently in ED with N/V x1 week. Patient has DM2 hx but not currently taking any DM medications.  Addendum 05/10/23@11 :33-Spoke with patient regarding DM control. Patient states that she last seen PCP in July 2024 and has not been back since then. Patient reports she was taking Metformin but stopped it due to GI intolerance and PCP prescribed Mounjaro but she never started it because insurance denied it. Patient reports she has taken Glipizide in the past as well but it did not work ("did nothing for sugar"). Patient  confirms that she has Medicaid insurance and she would like to get a new PCP. Informed patient that TOC would be consulted to see if they have a list of local providers that take Medicaid that are taking new patients. Also informed patient that she would need to call Medicaid office and have PCP changed on Medicaid card. Patient states that her grandmother, sister, and her daughter take insulin so she is familiar with how to use an insulin pen but she has never taken insulin herself. Discussed prior A1C of 12.3% from 08/22/22.  Discussed glucose and A1C goals. Patient states that she checks glucose periodically at home and it is usually in the 300's mg/dl.  Discussed importance of checking CBGs and maintaining good CBG control to prevent long-term and short-term complications. Explained how hyperglycemia leads to damage within blood vessels which lead to the common complications seen with uncontrolled diabetes. Stressed to the patient the importance of improving glycemic control to prevent further complications from uncontrolled diabetes. Patient states she has pain/numbness/tingling in legs and feet periodically and when she eats lately she feels bloated and food does not digest as it should (question if patient has gastroparesis).  Reviewed and demonstrated how to use the insulin pen and patient states she knows how to use it. Asked patient to be sure to get new PCP if not going back to prior PCP for follow up so she can continue to get needed prescriptions and also assistance with improving DM control. Patient  verbalized understanding of information discussed and reports no further questions at this time related to diabetes.   Thanks, Orlando Penner, RN, MSN, CDCES Diabetes Coordinator Inpatient Diabetes Program [phone] (Team Pager from 8am to 5pm)

## 2023-05-10 NOTE — ED Notes (Signed)
 Pt given cracker and diet gingerale for PO challenge.

## 2023-05-10 NOTE — Discharge Instructions (Addendum)
 You were seen in the ER for your vomiting and diarrhea.  We fortunately did not find an emergency cause for this.  I suspect she may have a viral illness.  I sent a prescription for nausea medicine to your pharmacy that you can take as needed.  Your urine was concerning for infection.  I sent a prescription for an antibiotic.  I also sent a prescription for a diabetes medication as well as insulin and testing strips.  Please monitor your glucose regularly.  Of included information to establish care with a primary care doctor.  Return to the ER for new or worsening symptoms.   Some PCP options in [place] area- not a comprehensive list  [facility]- [phone] [facility]- [phone] [facility]- [phone] [facility]- [phone] [facility]- [phone] [facility]- [phone]  or [facility] Physician Referral Line [phone]

## 2023-05-10 NOTE — ED Triage Notes (Signed)
 Pt to ED via POV from home. Pt reports abd pain and N/V x1 wk. Pt reports not currently on any medication for DM. Pt also reports now when she has her period she has to wear adult diapers due to her flow being so heavy.

## 2023-05-10 NOTE — TOC CM/SW Note (Signed)
 CSW acknowledges consult for new PCP. CSW added list to AVS. Patient left before she could be seen by TOC.  Charlynn Court, CSW [phone]

## 2023-05-10 NOTE — ED Provider Notes (Signed)
 [facility] Provider Note    Event Date/Time   First MD Initiated Contact with Patient 05/10/23 [phone]     (approximate)   History   Abdominal Pain   HPI  [name] Wolf is a 38 year old female with history of diabetes presenting to the emergency department for evaluation of abdominal pain, vomiting, diarrhea.  Symptoms ongoing for about a week.  No fevers.  No known sick contacts.  Tolerating some p.o. intake.  Does report history of heavy menstrual cycles, but is not currently on her period.  Denies recent exposure, but is requesting STD testing.  Physical Exam   Triage Vital Signs: ED Triage Vitals [05/10/23 0900]  Encounter Vitals Group     BP (!) 147/111     Systolic BP Percentile      Diastolic BP Percentile      Pulse Rate (!) 104     Resp 20     Temp 98 F (36.7 C)     Temp Source Oral     SpO2 98 %     Weight      Height      Head Circumference      Peak Flow      Pain Score 7     Pain Loc      Pain Education      Exclude from Growth Chart     Most recent vital signs: Vitals:   05/10/23 0900 05/10/23 1152  BP: (!) 147/111 (!) 142/95  Pulse: (!) 104 88  Resp: 20 16  Temp: 98 F (36.7 C)   SpO2: 98% 100%     General: Awake, interactive  CV:  Regular rate, good peripheral perfusion.  Resp:  Unlabored respirations, lungs clear to auscultation Abd:  Nondistended, soft, no significant tenderness to palpation  Neuro:  Symmetric facial movement, fluid speech   ED Results / Procedures / Treatments   Labs (all labs ordered are listed, but only abnormal results are displayed) Labs Reviewed  LIPASE, BLOOD - Abnormal; Notable for the following components:      Result Value   Lipase 58 (*)    All other components within normal limits  COMPREHENSIVE METABOLIC PANEL - Abnormal; Notable for the following components:   Glucose, Bld 375 (*)    Albumin 3.3 (*)    All other components within normal limits  CBC - Abnormal; Notable  for the following components:   Hemoglobin 10.4 (*)    HCT 34.6 (*)    MCV 73.3 (*)    MCH 22.0 (*)    RDW 18.0 (*)    Platelets 406 (*)    All other components within normal limits  URINALYSIS, ROUTINE W REFLEX MICROSCOPIC - Abnormal; Notable for the following components:   Color, Urine YELLOW (*)    APPearance CLOUDY (*)    Glucose, UA >=500 (*)    Leukocytes,Ua LARGE (*)    Bacteria, UA FEW (*)    All other components within normal limits  CBG MONITORING, ED - Abnormal; Notable for the following components:   Glucose-Capillary 323 (*)    All other components within normal limits  CHLAMYDIA/NGC RT PCR ([facility] ONLY)            GASTROINTESTINAL PANEL BY PCR, STOOL (REPLACES STOOL CULTURE)  URINE CULTURE  RPR  HIV ANTIBODY (ROUTINE TESTING W REFLEX)  POC URINE PREG, ED     EKG EKG independently reviewed interpreted by myself (ER attending) demonstrates:    RADIOLOGY Imaging independently  reviewed and interpreted by myself demonstrates:    PROCEDURES:  Critical Care performed: No  Procedures   MEDICATIONS ORDERED IN ED: Medications  ondansetron (ZOFRAN) injection 4 mg (4 mg Intravenous Given 05/10/23 1147)  sodium chloride 0.9 % bolus 1,000 mL (1,000 mLs Intravenous New Bag/Given 05/10/23 1146)  ketorolac (TORADOL) 15 MG/ML injection 15 mg (15 mg Intravenous Given 05/10/23 1146)     IMPRESSION / MDM / ASSESSMENT AND PLAN / ED COURSE  I reviewed the triage vital signs and the nursing notes.  Differential diagnosis includes, but is not limited to, viral GI illness, lower suspicion significant acute intra-abdominal process given reassuring abdominal exam, UTI, hyperglycemia, lower suspicion DKA  Patient's presentation is most consistent with acute presentation with potential threat to life or bodily function.  38 year old female presenting with vomiting and diarrhea.  Mild tachycardia on presentation, improved after fluids.  Labs with hyperglycemia without evidence of  DKA.  Minimally elevated lipase but under 3 times upper limit of normal, not consistent with pancreatitis.  Urinalysis is somewhat contaminated, but concerning for infection.  Will send urine culture. Patient was evaluated by diabetes RN Ulice Dash.  She reviewed the patient's records noting history of longstanding elevated A1c.  Recommends initiation of 5 mg of Tradjenta and 10 mg of Lantus daily.  Will also DC with prescription for Macrobid in the setting of possible UTI.  Patient reassessed after IV fluids, Zofran, Toradol here.  Feels improved.  Tolerating p.o.  She is comfortable with discharge home.  Strict return precautions provided.      FINAL CLINICAL IMPRESSION(S) / ED DIAGNOSES   Final diagnoses:  Nausea vomiting and diarrhea  Hyperglycemia  Acute cystitis without hematuria     Rx / DC Orders   ED Discharge Orders          Ordered    nitrofurantoin, macrocrystal-monohydrate, (MACROBID) 100 MG capsule  2 times daily        05/10/23 1348    insulin glargine (LANTUS) 100 UNIT/ML Solostar Pen  Daily        05/10/23 1348    linagliptin (TRADJENTA) 5 MG TABS tablet  Daily        05/10/23 1348             Note:  This document was prepared using Dragon voice recognition software and may include unintentional dictation errors.   Trinna Post, MD 05/10/23 [phone]

## 2023-05-11 LAB — RPR: RPR Ser Ql: NONREACTIVE

## 2023-05-11 LAB — URINE CULTURE: Culture: 20000 — AB

## 2023-07-10 ENCOUNTER — Ambulatory Visit: Admitting: Nurse Practitioner

## 2023-07-10 VITALS — BP 133/88 | HR 92 | Ht 63.0 in | Wt 176.8 lb

## 2023-07-10 DIAGNOSIS — Z3009 Encounter for other general counseling and advice on contraception: Secondary | ICD-10-CM

## 2023-07-10 DIAGNOSIS — Z309 Encounter for contraceptive management, unspecified: Secondary | ICD-10-CM

## 2023-07-10 DIAGNOSIS — Z113 Encounter for screening for infections with a predominantly sexual mode of transmission: Secondary | ICD-10-CM

## 2023-07-10 DIAGNOSIS — N814 Uterovaginal prolapse, unspecified: Secondary | ICD-10-CM

## 2023-07-10 DIAGNOSIS — Z3202 Encounter for pregnancy test, result negative: Secondary | ICD-10-CM

## 2023-07-10 LAB — HM HIV SCREENING LAB: HM HIV Screening: NEGATIVE

## 2023-07-10 LAB — WET PREP FOR TRICH, YEAST, CLUE
Clue Cell Exam: NEGATIVE
Trichomonas Exam: NEGATIVE
Yeast Exam: NEGATIVE

## 2023-07-10 LAB — HEPATITIS B SURFACE ANTIGEN

## 2023-07-10 LAB — HM HEPATITIS C SCREENING LAB: HM Hepatitis Screen: NEGATIVE

## 2023-07-10 LAB — PREGNANCY, URINE: Preg Test, Ur: NEGATIVE

## 2023-07-10 NOTE — Progress Notes (Signed)
 Pt is here for sti and pt.  Results reviewed with pt and no treatment per standing order. PCP list and referral info given to pt. Condoms declined. Caren Channel, RN

## 2023-07-15 LAB — GONOCOCCUS CULTURE

## 2023-07-25 ENCOUNTER — Encounter: Payer: Self-pay | Admitting: Nurse Practitioner
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
## 2023-08-01 ENCOUNTER — Ambulatory Visit

## 2023-08-07 ENCOUNTER — Ambulatory Visit

## 2023-09-04 ENCOUNTER — Telehealth: Payer: Self-pay | Admitting: Pharmacy Technician

## 2023-09-04 ENCOUNTER — Other Ambulatory Visit (HOSPITAL_COMMUNITY): Payer: Self-pay

## 2023-09-04 ENCOUNTER — Encounter: Payer: Self-pay | Admitting: Oncology

## 2023-09-04 NOTE — Telephone Encounter (Signed)
 In error

## 2023-09-07 ENCOUNTER — Encounter: Payer: Self-pay | Admitting: Oncology

## 2023-10-18 DIAGNOSIS — N814 Uterovaginal prolapse, unspecified: Secondary | ICD-10-CM | POA: Diagnosis not present

## 2023-10-18 DIAGNOSIS — Z1331 Encounter for screening for depression: Secondary | ICD-10-CM | POA: Diagnosis not present

## 2023-10-18 DIAGNOSIS — Z1159 Encounter for screening for other viral diseases: Secondary | ICD-10-CM | POA: Diagnosis not present

## 2023-10-18 DIAGNOSIS — E119 Type 2 diabetes mellitus without complications: Secondary | ICD-10-CM | POA: Diagnosis not present

## 2023-10-18 DIAGNOSIS — R197 Diarrhea, unspecified: Secondary | ICD-10-CM | POA: Diagnosis not present

## 2023-10-18 DIAGNOSIS — Z1329 Encounter for screening for other suspected endocrine disorder: Secondary | ICD-10-CM | POA: Diagnosis not present

## 2023-10-18 DIAGNOSIS — Z1389 Encounter for screening for other disorder: Secondary | ICD-10-CM | POA: Diagnosis not present
